# Patient Record
Sex: Female | Born: 1980 | Race: White | Hispanic: No | Marital: Married | Smoking: Former smoker
Health system: Southern US, Community
[De-identification: ages and names within clinical notes are randomized; demographics above are authoritative.]

## PROBLEM LIST (undated history)

## (undated) DIAGNOSIS — O09299 Supervision of pregnancy with other poor reproductive or obstetric history, unspecified trimester: Secondary | ICD-10-CM

## (undated) DIAGNOSIS — F32A Depression, unspecified: Secondary | ICD-10-CM

## (undated) DIAGNOSIS — K219 Gastro-esophageal reflux disease without esophagitis: Secondary | ICD-10-CM

## (undated) DIAGNOSIS — D649 Anemia, unspecified: Secondary | ICD-10-CM

## (undated) DIAGNOSIS — F419 Anxiety disorder, unspecified: Secondary | ICD-10-CM

## (undated) DIAGNOSIS — T8859XA Other complications of anesthesia, initial encounter: Secondary | ICD-10-CM

## (undated) DIAGNOSIS — Z9889 Other specified postprocedural states: Secondary | ICD-10-CM

## (undated) DIAGNOSIS — M1990 Unspecified osteoarthritis, unspecified site: Secondary | ICD-10-CM

## (undated) DIAGNOSIS — I10 Essential (primary) hypertension: Secondary | ICD-10-CM

## (undated) DIAGNOSIS — O24419 Gestational diabetes mellitus in pregnancy, unspecified control: Secondary | ICD-10-CM

## (undated) DIAGNOSIS — L509 Urticaria, unspecified: Secondary | ICD-10-CM

## (undated) DIAGNOSIS — F329 Major depressive disorder, single episode, unspecified: Secondary | ICD-10-CM

## (undated) DIAGNOSIS — R112 Nausea with vomiting, unspecified: Secondary | ICD-10-CM

## (undated) DIAGNOSIS — J189 Pneumonia, unspecified organism: Secondary | ICD-10-CM

## (undated) HISTORY — DX: Urticaria, unspecified: L50.9

---
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
## 2002-09-13 ENCOUNTER — Emergency Department (HOSPITAL_COMMUNITY): Admission: EM | Admit: 2002-09-13 | Discharge: 2002-09-13 | Payer: Self-pay | Admitting: Emergency Medicine

## 2003-09-30 ENCOUNTER — Emergency Department (HOSPITAL_COMMUNITY): Admission: EM | Admit: 2003-09-30 | Discharge: 2003-10-01 | Payer: Self-pay | Admitting: Emergency Medicine

## 2004-05-27 ENCOUNTER — Emergency Department (HOSPITAL_COMMUNITY): Admission: EM | Admit: 2004-05-27 | Discharge: 2004-05-27 | Payer: Self-pay | Admitting: Emergency Medicine

## 2006-03-29 IMAGING — CR DG ABDOMEN ACUTE W/ 1V CHEST
3 series · 3 of 3 positions shown · non-contrast
Comparison: none

CLINICAL DATA: midabdominal pressure for one week
 ACUTE ABDOMINAL SERIES (INCLUDING ONE VIEW CHEST)
 No comparison.  PA chest demonstrates normal cardiomediastinal contours and clear lungs aside from a possible granuloma in the midright lung.  There is no pleural effusion or pneumoperitoneum.  Supine and erect views of the abdomen demonstrate a normal bowel gas pattern.  There are no suspicious calcifications.  Bilateral pelvic calcifications are likely phleboliths.  The patient has undergone previous left hip pinning.
 IMPRESSION
 No active cardiopulmonary or abdominal process demonstrated.

[view not recorded (1 of 3)]
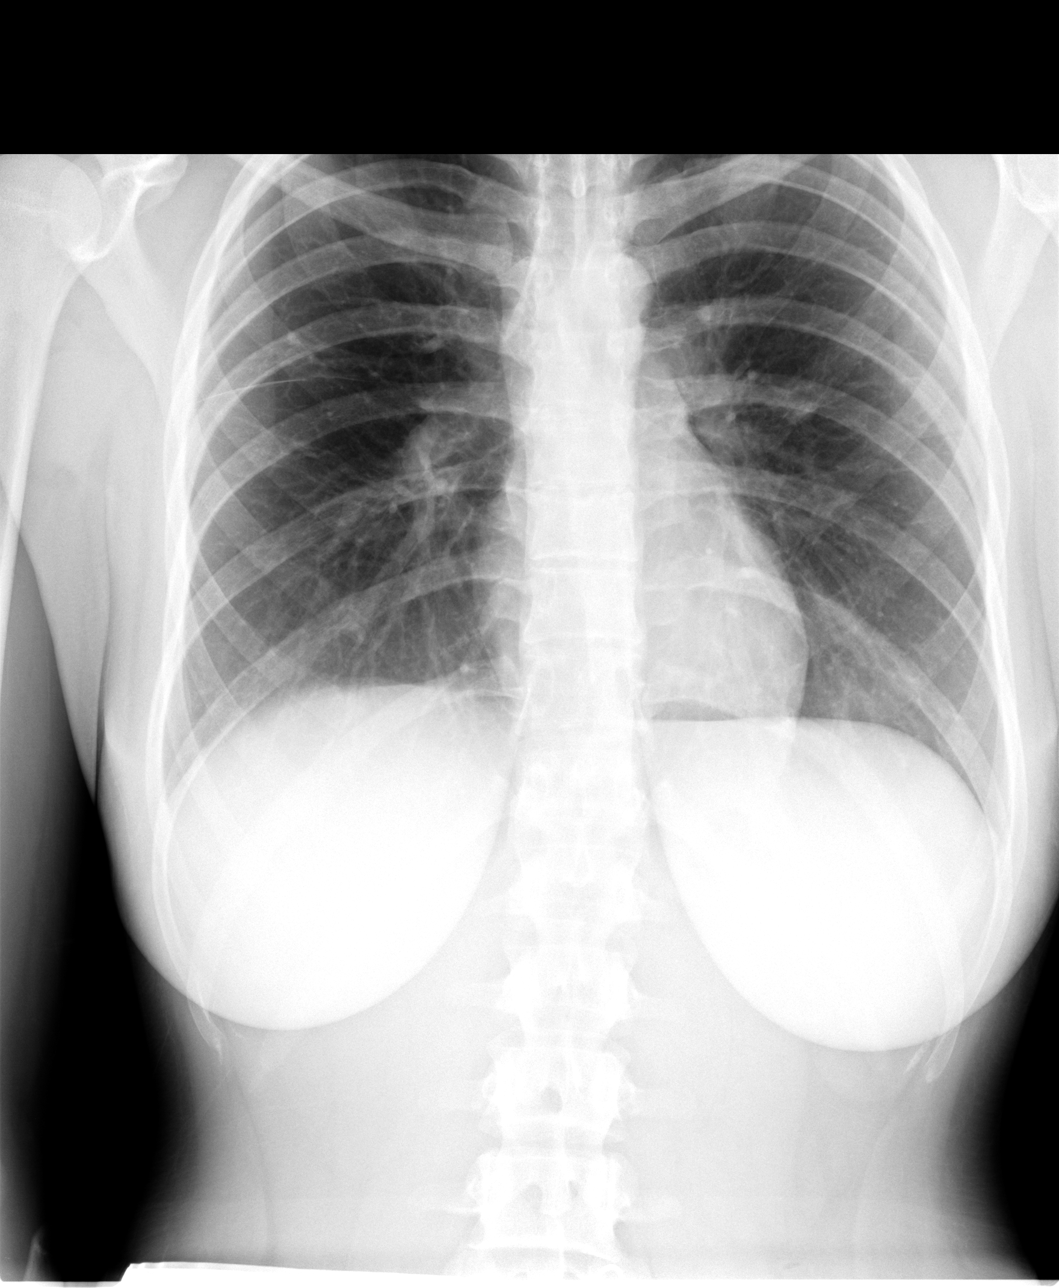

[view not recorded (2 of 3)]
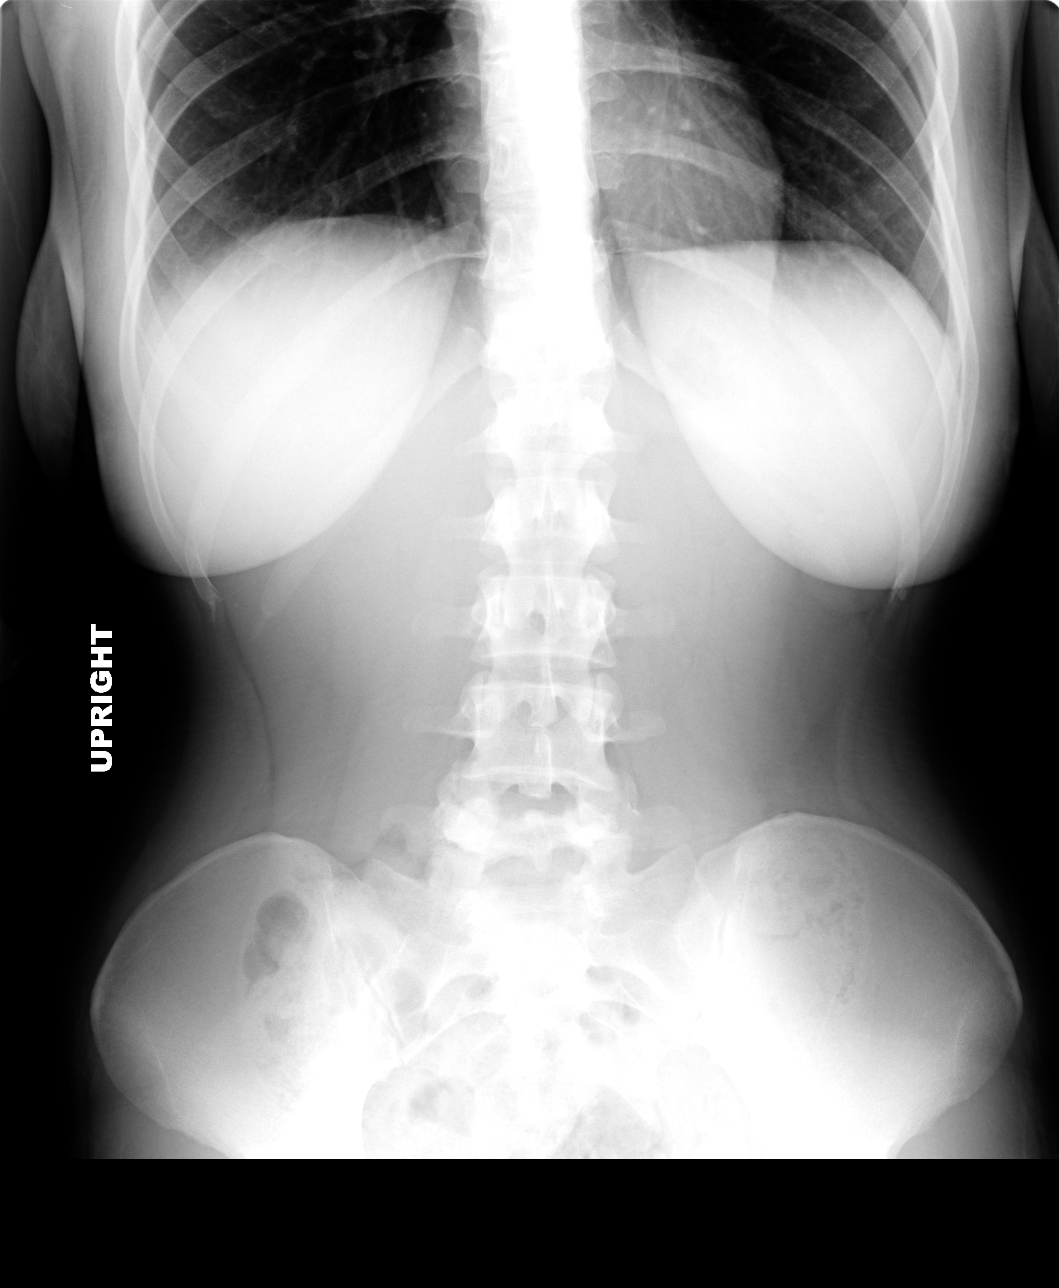

[view not recorded (3 of 3)]
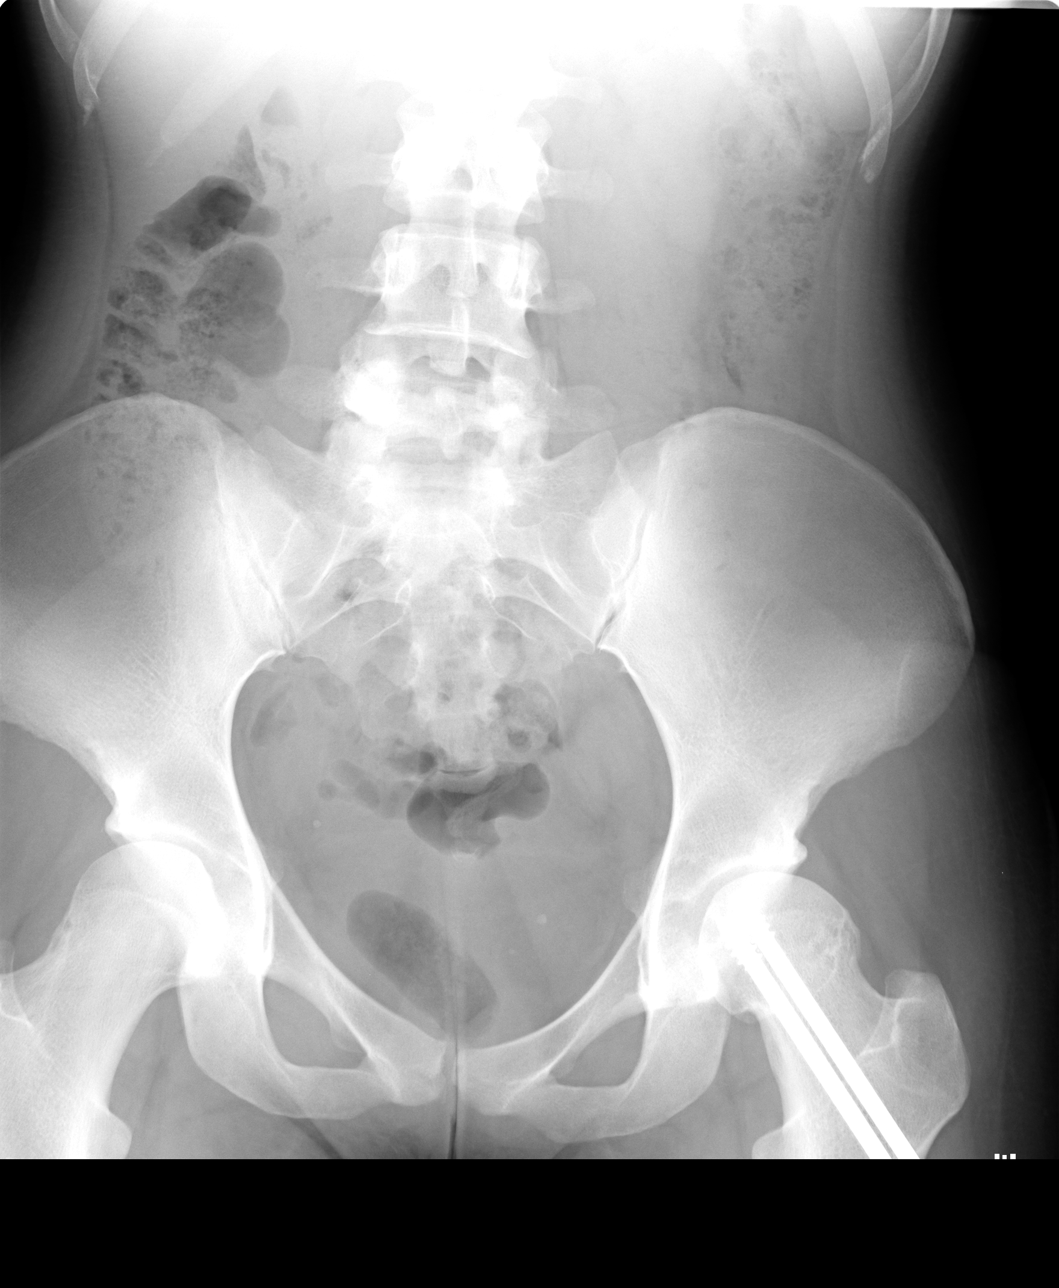

[3 of 3 positions shown; findings below may reference images not displayed]

## 2008-02-20 ENCOUNTER — Encounter (INDEPENDENT_AMBULATORY_CARE_PROVIDER_SITE_OTHER): Payer: Self-pay | Admitting: Obstetrics and Gynecology

## 2008-02-20 ENCOUNTER — Inpatient Hospital Stay (HOSPITAL_COMMUNITY): Admission: AD | Admit: 2008-02-20 | Discharge: 2008-02-23 | Payer: Self-pay | Admitting: Obstetrics and Gynecology

## 2011-02-18 NOTE — Discharge Summary (Signed)
NAME:  Debra Giles, Debra Giles               ACCOUNT NO.:  [id number]   MEDICAL RECORD NO.:  [id number]          PATIENT TYPE:  INP   LOCATION:  9148                          FACILITY:  [facility]   PHYSICIAN:  Michelle L. Grewal, M.D.DATE OF BIRTH:  Oct 09, 1980   DATE OF ADMISSION:  02/20/2008  DATE OF DISCHARGE:  02/23/2008                               DISCHARGE SUMMARY   ADMITTING DIAGNOSES:  1. Intrauterine pregnancy at 37-1/2 weeks estimated gestational age.  2. Breech presentation.  3. Severe right upper quadrant pain associated with elevated liver      function tests consistent with developing HELLP syndrome.   DISCHARGE DIAGNOSIS:  Status post low transverse cesarean section to a  viable female infant.   PROCEDURE:  Primary low transverse cesarean section.   REASON FOR ADMISSION:  Please see dictated H&P.   HOSPITAL COURSE:  The patient is a 30 year old white married female  primigravida who was admitted to [facility] at 37-1/2  weeks estimated gestational age for a cesarean delivery.  The patient  had been seen in the office where she had complained of some acute onset  of right upper quadrant pain with associated nausea and vomiting.  The  patient had been seen earlier in the week where she was noted to have  some elevation in her blood pressure, however, mild at 130 to 80s.  PIH  panel had revealed an elevation in SGPT of 44.  The uric acid was 4.6.  The patient did undergo ultrasound which revealed the baby was in a  breech presentation.  In view of this severe right upper quadrant pain,  elevated liver function tests and blood pressure, a decision was made to  proceed with a primary cesarean delivery in view of developing HELLP  syndrome.  The patient was now admitted for delivery.  The patient was  taken to the operating room where spinal anesthesia was administered  without difficulty.  A low transverse incision was made with delivery of  a viable female infant with  Agars of 8 at 1 minute and 9 at 5 minutes.  The patient tolerated the procedure well and was taken to the recovery  room in stable condition.  The patient was subsequently transferred to  the [facility] where she was given magnesium sulfate for 24 hours at closed  observation.  On postoperative day #1, the patient was without  complaint.  She denied any right upper quadrant pain.  Vital signs were  stable.  Blood pressures in the 123/60s.  SGOT was 26 and SGPT was 36.  Hemoglobin 8.6, platelet count 229,000.  Deep tendon reflex were 2+.  Abdomen soft.  Fundus firm and nontender.  Abdominal dressing noted to  be clean, dry and intact.  The patient continued on magnesium sulfate  and PIH labs were ordered for the following morning.  On postoperative  day #2, the patient was doing well.  She denied any complaints of  headache, blurred vision or right upper quadrant pain.  Blood pressure  was 120/70s.  Abdomen soft.  Abdominal dressing had been removed  revealing an incision that  was clean, dry and intact.   Laboratory findings had revealed normal lab values with some slight  decrease in platelet count  to 183,000, hemoglobin was stable and 8.4.  The patient was now transferred to the mother baby unit.  On  postoperative day #3, the patient did desire discharge.  Vital signs  were stable.  She was afebrile.  Fundus firm and nontender.  Incision  was clean, dry and intact.  Staples were removed and the patient was  later discharged home.   CONDITION ON DISCHARGE:  Stable.   DIET:  Regular as tolerated.   ACTIVITY:  No heavy lifting, no driving x2 weeks, and no vaginal entry.   FOLLOW UP:  The patient to follow up in the office in 1 week for an  incision check.  She is to call for temperature greater than 100  degrees, persistent nausea, vomiting, heavy vaginal bleeding, and/or  redness or drainage from the incisional site.  The patient was also  instructed to call for headache, blurred vision,  or right upper quadrant  pain.   DISCHARGE MEDICATIONS:  1. Tylox #30 one p.o. every 4-6 hours p.r.n.  2. Motrin 600 mg every 6 hours.  3. Prenatal vitamins one p.o. daily.  4. Colace n.p.o. daily p.r.n.      Julio Sicks, N.P.      Stann Mainland. Vincente Poli, M.D.  Electronically Signed    CC/MEDQ  D:  02/23/2008  T:  02/23/2008  Job:  [id number]

## 2011-02-18 NOTE — H&P (Signed)
NAME:  Debra Giles, Debra Giles NO.:  [id number]   MEDICAL RECORD NO.:  [id number]          PATIENT TYPE:   LOCATION:                                 FACILITY:   PHYSICIAN:  Juluis Mire, M.D.   DATE OF BIRTH:  03/02/1981   DATE OF ADMISSION:  DATE OF DISCHARGE:                              HISTORY & PHYSICAL   The patient is a 30 year old primigravida female at 37-1/2 weeks.  She  presents with the acute onset of right upper quadrant pain that is  fairly intense and severe, increasing in severity with associated nausea  and vomiting.  She had been evaluated in the office earlier in the week  with elevated blood pressure.  Blood pressures here were slightly  elevated in the 130s/80s.  We did PIH panel.  She had an elevated liver  function tests with an elevated SGPT of 44.  Remaining liver tests were  negative.  Her uric acid was 4.6.  We did an ultrasound.  The infant was  in the breech presentation.  In view of the severe right upper quadrant  pain, elevated liver function tests and blood pressure, we felt she was  developing HELLP syndrome and in view of advanced gestation, we decided  to proceed with primary cesarean section for which she is admitted.   In terms of allergies, patient has no known drug allergies.   MEDICATIONS:  Include prenatal vitamins.   For past medical history, family history and social history, please see  prenatal records.   REVIEW OF SYSTEMS:  Noncontributory.   PHYSICAL EXAMINATION:  The patient's blood pressure 130/88.  The vital  signs are stable.  HEENT:  The patient is normocephalic.  Pupils are equal, round, and  reactive to light and accommodation.  Extraocular movements were intact.  Sclerae and conjunctivae are clear.  Oropharynx clear.  NECK:  Without thyromegaly.  BREASTS:  Not examined.  LUNGS:  Clear.  CARDIOVASCULAR SYSTEM:  Regular rhythm and rate, grade 2/6 systolic  ejection murmur.  ABDOMEN:  Gravid uterus  consistent with date.  Severe right upper  quadrant tenderness to palpation.  PELVIC:  Cervix fingertip and long.  Breech presentation noted.  EXTREMITIES:  Edema 3+ .  Deep tendon reflexes 3+.  Two beats of clonus.  Subsequent ultrasound confirmed breech presentation.   Laboratories revealed a platelet count of 271.  She had negative  protein, did have an elevated SGPT of 44.  SGOT was 33.  LDH was 194,  uric acid was 4.6.   IMPRESSION:  1. Intrauterine pregnancy at 37-1/2 weeks' with breech presentation.  2. Severe right upper quadrant discomfort with associated elevated      liver function test consistent with developing HELLP syndrome.   PLAN:  The patient will undergo primary cesarean section.  The risks  were discussed including the risk of infection, the risk of hemorrhage,  could require transfusion with the risk of AIDS or hepatitis.  The risk  of injury to adjacent organs including bladder, bowel, ureters that  could require further exploratory surgery.  Risk of deep venous  thrombosis  or pulmonary embolus.  The risk of premature versus the risk  of HELLP syndrome has been discussed.  In view of the findings and her  symptomatology, I believe that the potential risk for the patient and  the baby out away the risk of premature.  The patient and her husband  agreed with this and will proceed with C-section.      Juluis Mire, M.D.  Electronically Signed     JSM/MEDQ  D:  02/20/2008  T:  02/21/2008  Job:  [id number]

## 2011-02-18 NOTE — Op Note (Signed)
NAME:  ROWENA, MOILANEN NO.:  [id number]   MEDICAL RECORD NO.:  [id number]         PATIENT TYPE:  INP   LOCATION:                                FACILITY:  [facility]   PHYSICIAN:  Juluis Mire, M.D.   DATE OF BIRTH:  [birth year]   DATE OF PROCEDURE:  02/20/2008  DATE OF DISCHARGE:                               OPERATIVE REPORT   PREOPERATIVE DIAGNOSES:  1. Intrauterine pregnancy at 37-1/2 weeks'.  2. Breech presentation.  3. Severe right upper quadrant pain with elevated liver function tests      consistent with developing toxemia.   POSTOPERATIVE DIAGNOSES:  1. Intrauterine pregnancy at 37-1/2 weeks'.  2. Breech presentation.  3. Severe right upper quadrant pain with elevated liver function tests      consistent with developing toxemia.   PROCEDURE:  Low transverse cesarean section.   SURGEON:  Juluis Mire, MD   ANESTHESIA:  Spinal.   ESTIMATED BLOOD LOSS:  500-600 mL.   PACKS AND DRAINS:  None.   INTRAOPERATIVE BLOOD PLACED:  None.   COMPLICATIONS:  None.   INDICATIONS:  Dictated in history and physical.   Procedure was as follows; the patient was taken to OR, placed in supine  position with left lateral tilt.  After satisfactory level of spinal  anesthesia was obtained, the abdomen was prepped out with Betadine and  draped in sterile field.  A low transverse skin incision was made with  knife carried through subcutaneous tissue.  The anterior rectus fascia  was entered sharply and the incision fashioned laterally.  Fascia was  taken off the muscle superiorly and inferiorly.  Rectus muscles were  separated in the midline.  Peritoneum was entered sharply.  Incision of  perineum extended both superiorly and inferiorly.  A low transverse  bladder flap was developed.  A low transverse uterine scission begun  with a knife and extended laterally using manual traction.  The infant  presented in the breech presentation was delivered.  The infant was a  viable female.  Apgars were 8/9.  Weight and pH were pending.  Placenta  was delivered manually and sent to pathology.  Uterus was exteriorized  for closure.  Uterus was closed with locking suture of 0 chromic using a  two-layer closure technique.  We had good hemostasis, clear urine  output.  Uterus was turned to the abdominal cavity.  It was of note, the  tubes and ovaries were unremarkable.  We irrigated the pelvis, had good  hemostasis, again clear urine output.  Muscles and peritoneum closed  with a running suture of  3-0 Vicryl.  Fascia closed with a running suture of 0 PDS.  Skin was  closed with staples and Steri-Strips.  Sponge, instrument, and needle  count was correct by circulating nurse x2.  Foley catheter remained  clear at time of closure.  The patient tolerated suture was turned to  recovery room in good condition.      Juluis Mire, M.D.  Electronically Signed     JSM/MEDQ  D:  02/20/2008  T:  02/21/2008  Job:  [id number] 

## 2011-07-02 LAB — COMPREHENSIVE METABOLIC PANEL
ALT: 44 — ABNORMAL HIGH
AST: 30
AST: 33
Alkaline Phosphatase: 117
Alkaline Phosphatase: 80
Alkaline Phosphatase: 88
BUN: 9
CO2: 24
CO2: 26
Calcium: 7.7 — ABNORMAL LOW
Calcium: 8.1 — ABNORMAL LOW
Chloride: 104
Chloride: 105
Chloride: 110
Creatinine, Ser: 0.73
GFR calc Af Amer: >60
GFR calc non Af Amer: >60
GFR calc non Af Amer: >60
Glucose, Bld: 136 — ABNORMAL HIGH
Sodium: 134 — ABNORMAL LOW
Sodium: 140
Total Bilirubin: 0.8
Total Bilirubin: 0.8
Total Protein: 5.9 — ABNORMAL LOW

## 2011-07-02 LAB — CBC
HCT: 24.9 — ABNORMAL LOW
HCT: 25.3 — ABNORMAL LOW
HCT: 31.2 — ABNORMAL LOW
MCHC: 33.9
MCHC: 34
MCV: 79
MCV: 79.1
MCV: 80.2
Platelets: 275
RBC: 3.2 — ABNORMAL LOW
RDW: 14.4
WBC: 12.9 — ABNORMAL HIGH
WBC: 13.3 — ABNORMAL HIGH
WBC: 9.8

## 2011-07-02 LAB — URINALYSIS, ROUTINE W REFLEX MICROSCOPIC
Ketones, ur: NEGATIVE
Protein, ur: NEGATIVE
Specific Gravity, Urine: >1.03 — ABNORMAL HIGH
Urobilinogen, UA: 0.2
pH: 6

## 2011-07-02 LAB — LACTATE DEHYDROGENASE
LDH: 190
LDH: 194
LDH: 227

## 2012-08-23 DIAGNOSIS — Z975 Presence of (intrauterine) contraceptive device: Secondary | ICD-10-CM | POA: Insufficient documentation

## 2016-06-06 DIAGNOSIS — I10 Essential (primary) hypertension: Secondary | ICD-10-CM | POA: Insufficient documentation

## 2016-06-06 DIAGNOSIS — F3342 Major depressive disorder, recurrent, in full remission: Secondary | ICD-10-CM | POA: Insufficient documentation

## 2016-06-06 DIAGNOSIS — Z8679 Personal history of other diseases of the circulatory system: Secondary | ICD-10-CM | POA: Insufficient documentation

## 2016-06-06 DIAGNOSIS — K219 Gastro-esophageal reflux disease without esophagitis: Secondary | ICD-10-CM | POA: Insufficient documentation

## 2016-06-06 DIAGNOSIS — Z6835 Body mass index (BMI) 35.0-35.9, adult: Secondary | ICD-10-CM | POA: Insufficient documentation

## 2017-03-24 ENCOUNTER — Emergency Department (HOSPITAL_COMMUNITY)
Admission: EM | Admit: 2017-03-24 | Discharge: 2017-03-24 | Disposition: A | Discharge status: Home / Self Care | Attending: Emergency Medicine | Admitting: Emergency Medicine

## 2017-03-24 ENCOUNTER — Encounter (HOSPITAL_COMMUNITY): Payer: Self-pay | Admitting: Obstetrics and Gynecology

## 2017-03-24 DIAGNOSIS — Y939 Activity, unspecified: Secondary | ICD-10-CM | POA: Diagnosis not present

## 2017-03-24 DIAGNOSIS — Y999 Unspecified external cause status: Secondary | ICD-10-CM | POA: Insufficient documentation

## 2017-03-24 DIAGNOSIS — S81851A Open bite, right lower leg, initial encounter: Secondary | ICD-10-CM | POA: Diagnosis not present

## 2017-03-24 DIAGNOSIS — Z23 Encounter for immunization: Secondary | ICD-10-CM | POA: Insufficient documentation

## 2017-03-24 DIAGNOSIS — I10 Essential (primary) hypertension: Secondary | ICD-10-CM | POA: Insufficient documentation

## 2017-03-24 DIAGNOSIS — W540XXA Bitten by dog, initial encounter: Secondary | ICD-10-CM | POA: Insufficient documentation

## 2017-03-24 DIAGNOSIS — Y929 Unspecified place or not applicable: Secondary | ICD-10-CM | POA: Diagnosis not present

## 2017-03-24 HISTORY — DX: Essential (primary) hypertension: I10

## 2017-03-24 HISTORY — DX: Unspecified osteoarthritis, unspecified site: M19.90

## 2017-03-24 LAB — I-STAT BETA HCG BLOOD, ED (MC, WL, AP ONLY)

## 2017-03-24 MED ORDER — OXYCODONE-ACETAMINOPHEN 5-325 MG PO TABS: 1.0000 | ORAL_TABLET | Freq: Four times a day (QID) | ORAL | 0 refills | Status: DC | PRN

## 2017-03-24 MED ORDER — TETANUS-DIPHTH-ACELL PERTUSSIS 5-2.5-18.5 LF-MCG/0.5 IM SUSP
0.5000 mL | Freq: Once | INTRAMUSCULAR | Status: AC
  Administered 2017-03-24: 0.5 mL via INTRAMUSCULAR
  Filled 2017-03-24: qty 0.5

## 2017-03-24 MED ORDER — ONDANSETRON 4 MG PO TBDP
4.0000 mg | ORAL_TABLET | Freq: Once | ORAL | Status: AC
  Administered 2017-03-24: 4 mg via ORAL
  Filled 2017-03-24: qty 1

## 2017-03-24 MED ORDER — AMOXICILLIN-POT CLAVULANATE 875-125 MG PO TABS
1.0000 | ORAL_TABLET | Freq: Once | ORAL | Status: AC
  Administered 2017-03-24: 1 via ORAL
  Filled 2017-03-24: qty 1

## 2017-03-24 MED ORDER — AMOXICILLIN-POT CLAVULANATE 875-125 MG PO TABS: 1.0000 | ORAL_TABLET | Freq: Two times a day (BID) | ORAL | 0 refills | Status: DC

## 2017-03-24 MED ORDER — HYDROMORPHONE HCL 1 MG/ML IJ SOLN
1.0000 mg | Freq: Once | INTRAMUSCULAR | Status: AC
  Administered 2017-03-24: 1 mg via INTRAVENOUS
  Filled 2017-03-24: qty 1

## 2017-03-24 NOTE — ED Provider Notes (Signed)
[facility]-EMERGENCY DEPT Provider Note   CSN: [id number] Arrival date & time: 03/24/17  1647     History   Chief Complaint Chief Complaint  Patient presents with  . Animal Bite    HPI Debra Giles is a 36 y.o. female who presents to the Emergency Department with multiple puncture wounds 2/2 to a dog bite this afternoon. She reports that her pit bull mix was fighting with a labrador from the neighborhood when one of the dogs got stuck on the other's collar and bit her when she tried to separate the dogs. She reports she called the Vet in route to the ED to verify both dogs were UTD on all vaccinations. She is unsure of her last tetanus. No treatment tried PTA aside from applying a bandage to the wound. She reports the pain is constant and 10/10  No pertinent PMH.   The history is provided by the patient and the spouse. No language interpreter was used.    Past Medical History:  Diagnosis Date  . Arthritis   . Hypertension     There are no active problems to display for this patient.   History reviewed. No pertinent surgical history.  OB History    Gravida Para Term Preterm AB Living             1   SAB TAB Ectopic Multiple Live Births                   Home Medications    Prior to Admission medications   Medication Sig Start Date End Date Taking? Authorizing Provider  amoxicillin-clavulanate (AUGMENTIN) 875-125 MG tablet Take 1 tablet by mouth 2 (two) times daily. 03/24/17   Ranessa Kosta A, PA-C  oxyCODONE-acetaminophen (PERCOCET/ROXICET) 5-325 MG tablet Take 1-2 tablets by mouth every 6 (six) hours as needed for severe pain. 03/24/17   Henri Guedes, Coral Else, PA-C    Family History History reviewed. No pertinent family history.  Social History Social History  Substance Use Topics  . Smoking status: Never Smoker  . Smokeless tobacco: Never Used  . Alcohol use No     Allergies   Patient has no allergy information on record.   Review of Systems Review of  Systems  Constitutional: Negative for chills and fever.  Musculoskeletal: Positive for myalgias. Negative for arthralgias.  Skin: Positive for wound.   Physical Exam Updated Vital Signs BP 117/72 (BP Location: Right Arm)   Pulse 62   Resp 16   Ht 5\' 5"  (1.651 m)   Wt 108.9 kg (240 lb)   LMP 02/24/2017 (Exact Date)   SpO2 95%   BMI 39.94 kg/m   Physical Exam  Constitutional: She appears well-developed and well-nourished. No distress.  Cardiovascular: Normal rate, regular rhythm, normal heart sounds and intact distal pulses.  Exam reveals no gallop and no friction rub.   No murmur heard. Pulmonary/Chest: Effort normal. No respiratory distress. She has no wheezes. She has no rales.  Musculoskeletal: Normal range of motion. She exhibits tenderness.  Patient with jagged 3 puncture wounds to the anterior aspect of the right lower leg. The largest wound is 1.5 cm middle wound. There is a 0.5 mm wound to the left and a 0.75 mm wound to the right. Hemostatic. No evidence of gangrene. Sensation intact. 5/5 motor strength of the bilateral lower extremities. Able to move all digits. No evidence of FB.   Skin: She is not diaphoretic.     ED Treatments / Results  Labs (  all labs ordered are listed, but only abnormal results are displayed) Labs Reviewed  I-STAT BETA HCG BLOOD, ED ([facility], [facility], [facility] ONLY)    EKG  EKG Interpretation None       Radiology No results found.  Procedures Irrigation Date/Time: 03/26/2017 12:35 AM Performed by: Lilian KapurMCDONALD, Patrycja Mumpower A Authorized by: Frederik PearMCDONALD, Laurisa Sahakian A  Consent: Verbal consent obtained. Consent given by: patient Local anesthesia used: no  Anesthesia: Local anesthesia used: no  Sedation: Patient sedated: no Patient tolerance: Patient tolerated the procedure well with no immediate complications    (including critical care time)  Medications Ordered in ED Medications  HYDROmorphone (DILAUDID) injection 1 mg (1 mg Intravenous Given 03/24/17 1929)    Tdap (BOOSTRIX) injection 0.5 mL (0.5 mLs Intramuscular Given 03/24/17 2019)  amoxicillin-clavulanate (AUGMENTIN) 875-125 MG per tablet 1 tablet (1 tablet Oral Given 03/24/17 2022)  ondansetron (ZOFRAN-ODT) disintegrating tablet 4 mg (4 mg Oral Given 03/24/17 2018)     Initial Impression / Assessment and Plan / ED Course  I have reviewed the triage vital signs and the nursing notes.  Pertinent labs & imaging results that were available during my care of the patient were reviewed by me and considered in my medical decision making (see chart for details).     Patient with 3 wounds to the right lower leg secondary to a dig bite this afternoon. Discussed the patient with Dr. Karma GanjaLinker, attending physician. Vaccinations UTP on the animal in addition to the other dog in the incident. The wound was copiously irrigated and explored through a full range of motion. No FBs noted. At this time, the full depth of the wound can be assess, and I do not feel I need an X-ray given the PE and history. After extensive cleaning, the wound was covered with Xeroform gauze and a gauze dressing was applied. TDap updated in the ED. First dose of Augmentin give in the ED. Pain controlled. Strict return precautions given. VSS NAD. Will recommend follow up for wound re-check in 3-5 days. NAD. The patient is stable for d/c at this time.   Final Clinical Impressions(s) / ED Diagnoses   Final diagnoses:  Dog bite, initial encounter    New Prescriptions Discharge Medication List as of 03/24/2017  8:13 PM    START taking these medications   Details  amoxicillin-clavulanate (AUGMENTIN) 875-125 MG tablet Take 1 tablet by mouth 2 (two) times daily., Starting Tue 03/24/2017, Print    oxyCODONE-acetaminophen (PERCOCET/ROXICET) 5-325 MG tablet Take 1-2 tablets by mouth every 6 (six) hours as needed for severe pain., Starting Tue 03/24/2017, Print         Karen Kinnard A, PA-C 03/26/17 [id number]    Jerelyn ScottLinker, Martha, MD 03/26/17  1750

## 2017-03-24 NOTE — Discharge Instructions (Signed)
Please change the bandage on the wound daily. You can keep the area clean with warm soap and water. Please start taking Augmentin tomorrow. He will take this medication twice daily for the next 10 days. Your first dose for tonight has been given here. You've also been given a short prescription of Percocet for pain control. This medication can be addicting and should not be used before driving or working. If you develop fever, chills, or if the wound becomes red, hot, or swollen, please return to the emergency department for reevaluation.

## 2017-03-24 NOTE — ED Triage Notes (Signed)
Pt states she was at home and her dogs were fighting when "one of the dogs got stuck on the other's collar and freaked out" and she tried to remove the collar and and one of the dogs bit her on the right leg. Pt called the Vet on the way to the ER to verify both dogs were caught up on all their shots.

## 2017-09-25 DIAGNOSIS — D500 Iron deficiency anemia secondary to blood loss (chronic): Secondary | ICD-10-CM

## 2017-09-25 HISTORY — DX: Iron deficiency anemia secondary to blood loss (chronic): D50.0

## 2018-05-06 ENCOUNTER — Encounter: Payer: Self-pay | Admitting: Allergy & Immunology

## 2018-05-06 ENCOUNTER — Ambulatory Visit (INDEPENDENT_AMBULATORY_CARE_PROVIDER_SITE_OTHER): Admitting: Allergy & Immunology

## 2018-05-06 VITALS — BP 110/80 | HR 103 | Resp 16 | Ht 65.0 in | Wt 234.0 lb

## 2018-05-06 DIAGNOSIS — L508 Other urticaria: Secondary | ICD-10-CM

## 2018-05-06 NOTE — Patient Instructions (Addendum)
1. Chronic urticaria - Your history does not have any "red flags" such as fevers, joint pains, or permanent skin changes that would be concerning for a more serious cause of hives.  - It is rather odd that the rash is isolated to the arms only and it is odd that you are so young to require a hip replacement.  - Please send me pictures of the rash so that I can put it in your chart: [email or web address] - We will get some labs to rule out serious causes of hives: complete blood count, tryptase level, chronic urticaria panel, ANA, CMP, ESR, and CRP. - We will call you in 1-2 weeks with the results of the testing.  - Continue to note any triggers of the rash.  - In the meantime, start suppressive dosing of antihistamines:   - Morning: Zyrtec (cetirizine) 10-79m (two tablets)  - Evening: Zyrtec (cetirizine) 10-284m(two tablets) + Singulair (montelukast) 1022m- If the above is not working, try adding: Zantac (ranitidine) 300m26mwo tablets) - You can change this dosing at home, decreasing the dose as needed or increasing the dosing as needed.   2. Return in about 3 months (around 08/06/2018).   Please inform us o[place]any Emergency Department visits, hospitalizations, or changes in symptoms. Call us b[place]oreaore going to the ED for breathing or allergy symptoms since we might be able to fit you in for a sick visit. Feel free to contact us a[place]atime with any questions, problems, or concerns.  It was a pleasure to meet you today!  Websites that have reliable patient information: 1. American Academy of Asthma, Allergy, and Immunology: www.aaaai.org 2. Food Allergy Research and Education (FARE): foodallergy.org 3. Mothers of Asthmatics: http://www.asthmacommunitynetwork.org 4. American College of Allergy, Asthma, and Immunology: www.MonthlyElectricBill.co.ukake sure you are registered to vote! If you have moved or changed any of your contact information, you will need to get this updated before  voting!

## 2018-05-06 NOTE — Progress Notes (Signed)
NEW PATIENT  Date of Service/Encounter:  08/0/19  Referring provider: Junie Spencer, PA   Assessment:   Chronic urticaria  Early onset arthritis of the hip   Debra Giles is a delightful 37 year old female presenting with emergence of a rash.  She has pictures of this, and they certainly look like bites.  However, they have come and gone since February 2019.  This is despite no change in her activities outdoors.  She is typically an indoor person, but they started when she was doing some heavy lifting outdoors.  These areas have never been draining.  She denies any trauma to these areas.  They do clear up without the use of antibiotics.  They are extremely pruritic, which certainly points towards a histamine mediated process.  There is certainly no allergen trigger to these, including foods.  No one else in the family has these, making an indoor insects unlikely.  She does have a history of early onset arthritis of her hip, which is rather odd for 37 year old.  She has never been seen by rheumatologist, but lesions such as these can be seen sometimes in rheumatoid arthritis.  Typically they are more nodular and less erythematous, but we will rule this out with some blood work.  We deferred skin testing since there was no history of allergic rhinitis symptoms whatsoever.  Plan/Recommendations:   1. Chronic urticaria - Your history does not have any "red flags" such as fevers, joint pains, or permanent skin changes that would be concerning for a more serious cause of hives.  - It is rather odd that the rash is isolated to the arms only and it is odd that you are so young to require a hip replacement.  - We will get some labs to rule out serious causes of hives: complete blood count, tryptase level, chronic urticaria panel, ANA, CMP, ESR, and CRP. - We will call you in 1-2 weeks with the results of the testing.  - Continue to note any triggers of the rash.  - In the meantime, start  suppressive dosing of antihistamines:   - Morning: Zyrtec (cetirizine) 10-26m (two tablets)  - Evening: Zyrtec (cetirizine) 10-235m(two tablets) + Singulair (montelukast) 1025m- If the above is not working, try adding: Zantac (ranitidine) 300m23mwo tablets) - You can change this dosing at home, decreasing the dose as needed or increasing the dosing as needed.   2. Return in about 3 months (around 08/06/2018).   Subjective:   CaseEMMALEE SOLIVANa 36 y47. female presenting today for evaluation of  Chief Complaint  Patient presents with  . Urticaria  . Rash    CaseKAMAIYA Giles a history of the following: There are no active problems to display for this patient.   History obtained from: chart review and patient.  CaseBrynda Giles referred by GordMindi Curling-C.     CaseTericaa 36 y33. female presenting for an evaluation of hives. She reports that on Febr02/29/2024r dog died. She had to drag his body out of the woods. She thought that she was bit by a bug. It will start as a mosquito bite and then become large and inflamed and pruritis. It is only on her arms and on the elbow up. These lesions become hard and very itchy. They last in total around one week or maybe two weeks and then they are gone. There is some permanent changes with some scarring. Cortisone cream does help  with the symptoms. She has tried another steroid cream but the hydrocortisone cream seems to help more. She had no GI symptoms at all and no fevers at all. Aside from the warmth at the spot itself, there was not redness. She has not tried taking regular antihistamines, but she does take them as needed without an improvement.  She did have an appointment with a dermatologist, but when her rash cleared up right before the appointment, she canceled it.  She has not rescheduled.  No one else is getting these in her home. She lives with her husband and two children. She does not works outside of the  home.  She is not prone to allergies and has not ARC symptoms or asthma symptoms. She denies food allergies. There is a strong family history of arthritis. She does have a history of left hip pain and actually has pins within it. She does not see a Rheumatologist but she does have an orthopedic doctor (Dr. Lorenz Coaster).    Otherwise, there is no history of other atopic diseases, including asthma, drug allergies, food allergies, environmental allergies, or stinging insect allergies There is no significant infectious history. Vaccinations are up to date.      Past Medical History: There are no active problems to display for this patient.   Medication List:  Allergies as of 05/06/2018      Reactions   Percocet [oxycodone-acetaminophen] Itching      Medication List        Accurate as of 05/06/18 11:59 PM. Always use your most recent med list.          cetirizine 10 MG tablet Commonly known as:  ZYRTEC AM: 10-20 mg( 2 tabs), and PM 10-20 mg ( 2 tabs)   citalopram 20 MG tablet Commonly known as:  CELEXA TAKE 1 TABLET(20 MG) BY MOUTH DAILY   ferrous sulfate 325 (65 FE) MG tablet Take by mouth.   lisinopril 20 MG tablet Commonly known as:  PRINIVIL,ZESTRIL Take by mouth.   montelukast 10 MG tablet Commonly known as:  SINGULAIR Take 1 tablet (10 mg total) by mouth at bedtime.   omeprazole 20 MG capsule Commonly known as:  PRILOSEC Take by mouth.   phentermine 37.5 MG capsule   ranitidine 300 MG tablet Commonly known as:  ZANTAC Take 1 tablet (300 mg total) by mouth 2 (two) times daily.   triamcinolone cream 0.1 % Commonly known as:  KENALOG Apply topically.       Birth History: non-contributory.   Developmental History: non-contributory.   Past Surgical History: Past Surgical History:  Procedure Laterality Date  . CESAREAN SECTION  02/20/2008  . HIP SURGERY Left 1997     Family History: Family History  Problem Relation Age of Onset  . Frontotemporal  dementia Mother        Primary Progressive Aphasia   . ADD / ADHD Brother      Social History: Debra Giles lives at home with her husband and 10 year old son. There is a 47yo step daughter who lives in   She was in a house that is 35 years old.  There is hardwood throughout the home, aside from the bedrooms which have carpet.  There is electric heating and central cooling.  There are 2 dogs inside of the home.  There are no dust mite covers on the bedding.  There is no tobacco exposure.  She has never been a smoker.    Review of Systems: a 14-point review of systems  is pertinent for what is mentioned in HPI.  Otherwise, all other systems were negative. Constitutional: negative other than that listed in the HPI Eyes: negative other than that listed in the HPI Ears, nose, mouth, throat, and face: negative other than that listed in the HPI Respiratory: negative other than that listed in the HPI Cardiovascular: negative other than that listed in the HPI Gastrointestinal: negative other than that listed in the HPI Genitourinary: negative other than that listed in the HPI Integument: negative other than that listed in the HPI Hematologic: negative other than that listed in the HPI Musculoskeletal: negative other than that listed in the HPI Neurological: negative other than that listed in the HPI Allergy/Immunologic: negative other than that listed in the HPI    Objective:   Blood pressure 110/80, pulse (!) 103, resp. rate 16, height _0  (1.651 m), weight 234 lb (106.1 kg), last menstrual period 04/27/2018, SpO2 97 %. Body mass index is 38.94 kg/m.   Physical Exam:  General: Alert, interactive, in no acute distress. Pleasant female. Absolutely hilarious Eyes: No conjunctival injection bilaterally, no discharge on the right, no discharge on the left and no Horner-Trantas dots present. PERRL bilaterally. EOMI without pain. No photophobia.  Ears: Right TM pearly gray with normal light reflex,  Left TM pearly gray with normal light reflex, Right TM intact without perforation and Left TM intact without perforation.  Nose/Throat: External nose within normal limits and septum midline. Turbinates non-edematous without discharge. Posterior oropharynx erythematous without cobblestoning in the posterior oropharynx. Tonsils unremarklable without exudates.  Tongue without thrush. Neck: Supple without thyromegaly. Trachea midline. Adenopathy: no enlarged lymph nodes appreciated in the anterior cervical, occipital, axillary, epitrochlear, inguinal, or popliteal regions. Lungs: Clear to auscultation without wheezing, rhonchi or rales. No increased work of breathing. CV: Normal S1/S2. No murmurs. Capillary refill <2 seconds.  Abdomen: Nondistended, nontender. No guarding or rebound tenderness. Bowel sounds present in all fields and hypoactive  Skin: Warm and dry, without lesions or rashes. There are 2-3 pinpoint eschars that are 31m in diameter on the left arm, otherwise the exam is unremarkable. There is no dermatographism.  Extremities:  No clubbing, cyanosis or edema. Neuro:   Grossly intact. No focal deficits appreciated. Responsive to questions.  Diagnostic studies: none      JSalvatore Marvel MD [facility]

## 2018-05-07 ENCOUNTER — Encounter: Payer: Self-pay | Admitting: Allergy & Immunology

## 2018-05-07 MED ORDER — CETIRIZINE HCL 10 MG PO TABS: ORAL_TABLET | ORAL | 5 refills | Status: DC

## 2018-05-07 MED ORDER — RANITIDINE HCL 300 MG PO TABS: 300.0000 mg | ORAL_TABLET | Freq: Two times a day (BID) | ORAL | 5 refills | Status: DC

## 2018-05-07 MED ORDER — MONTELUKAST SODIUM 10 MG PO TABS: 10.0000 mg | ORAL_TABLET | Freq: Every day | ORAL | 5 refills | Status: DC

## 2018-05-12 ENCOUNTER — Encounter: Payer: Self-pay | Admitting: Allergy & Immunology

## 2018-05-14 LAB — COMPREHENSIVE METABOLIC PANEL
ALK PHOS: 58 IU/L (ref 39–117)
ALT: 21 IU/L (ref 0–32)
AST: 15 IU/L (ref 0–40)
Albumin/Globulin Ratio: 1.9 (ref 1.2–2.2)
Albumin: 4.5 g/dL (ref 3.5–5.5)
BILIRUBIN TOTAL: 0.4 mg/dL (ref 0.0–1.2)
BUN/Creatinine Ratio: 16 (ref 9–23)
BUN: 12 mg/dL (ref 6–20)
CHLORIDE: 100 mmol/L (ref 96–106)
CO2: 26 mmol/L (ref 20–29)
Calcium: 9.3 mg/dL (ref 8.7–10.2)
Creatinine, Ser: 0.74 mg/dL (ref 0.57–1.00)
GFR calc Af Amer: 122 mL/min/{1.73_m2} (ref >59)
GFR calc non Af Amer: 106 mL/min/{1.73_m2} (ref >59)
GLOBULIN, TOTAL: 2.4 g/dL (ref 1.5–4.5)
GLUCOSE: 102 mg/dL — AB (ref 65–99)
Potassium: 4.1 mmol/L (ref 3.5–5.2)
SODIUM: 141 mmol/L (ref 134–144)
Total Protein: 6.9 g/dL (ref 6.0–8.5)

## 2018-05-14 LAB — C-REACTIVE PROTEIN: CRP: 6 mg/L (ref 0–10)

## 2018-05-14 LAB — IGE+ALLERGENS ZONE 2(30)
Alternaria Alternata IgE: <0.1 kU/L
Amer Sycamore IgE Qn: <0.1 kU/L
Bermuda Grass IgE: <0.1 kU/L
Cat Dander IgE: <0.1 kU/L
Cedar, Mountain IgE: <0.1 kU/L
D Farinae IgE: <0.1 kU/L
D Pteronyssinus IgE: <0.1 kU/L
Dog Dander IgE: <0.1 kU/L
Elm, American IgE: <0.1 kU/L
Hickory, White IgE: <0.1 kU/L
IgE (Immunoglobulin E), Serum: 25 IU/mL (ref 6–495)
Maple/Box Elder IgE: <0.1 kU/L
Nettle IgE: <0.1 kU/L
Oak, White IgE: <0.1 kU/L
Penicillium Chrysogen IgE: <0.1 kU/L
Pigweed, Rough IgE: <0.1 kU/L
Plantain, English IgE: <0.1 kU/L
Ragweed, Short IgE: <0.1 kU/L
Sheep Sorrel IgE Qn: <0.1 kU/L
Stemphylium Herbarum IgE: <0.1 kU/L
Timothy Grass IgE: <0.1 kU/L
White Mulberry IgE: <0.1 kU/L

## 2018-05-14 LAB — CBC WITH DIFFERENTIAL/PLATELET
Basophils Absolute: 0 10*3/uL (ref 0.0–0.2)
Basos: 0 %
EOS (ABSOLUTE): 0.4 10*3/uL (ref 0.0–0.4)
Eos: 5 %
HEMOGLOBIN: 12.4 g/dL (ref 11.1–15.9)
Hematocrit: 39.8 % (ref 34.0–46.6)
IMMATURE GRANS (ABS): 0 10*3/uL (ref 0.0–0.1)
Immature Granulocytes: 0 %
LYMPHS: 29 %
Lymphocytes Absolute: 2.5 10*3/uL (ref 0.7–3.1)
MCH: 24 pg — ABNORMAL LOW (ref 26.6–33.0)
MCHC: 31.2 g/dL — AB (ref 31.5–35.7)
MCV: 77 fL — ABNORMAL LOW (ref 79–97)
MONOCYTES: 4 %
Monocytes Absolute: 0.3 10*3/uL (ref 0.1–0.9)
Neutrophils Absolute: 5.2 10*3/uL (ref 1.4–7.0)
Neutrophils: 62 %
PLATELETS: 342 10*3/uL (ref 150–450)
RBC: 5.16 x10E6/uL (ref 3.77–5.28)
RDW: 15.5 % — ABNORMAL HIGH (ref 12.3–15.4)
WBC: 8.4 10*3/uL (ref 3.4–10.8)

## 2018-05-14 LAB — CHRONIC URTICARIA

## 2018-05-14 LAB — SEDIMENTATION RATE: SED RATE: 26 mm/h (ref 0–32)

## 2018-05-14 LAB — ANA: ANA: NEGATIVE

## 2018-05-14 LAB — TRYPTASE: TRYPTASE: 4.8 ug/L (ref 2.2–13.2)

## 2018-05-31 ENCOUNTER — Encounter: Payer: Self-pay | Admitting: Allergy & Immunology

## 2018-05-31 ENCOUNTER — Ambulatory Visit (INDEPENDENT_AMBULATORY_CARE_PROVIDER_SITE_OTHER): Admitting: Allergy & Immunology

## 2018-05-31 VITALS — BP 112/80 | HR 72 | Resp 16

## 2018-05-31 DIAGNOSIS — L253 Unspecified contact dermatitis due to other chemical products: Secondary | ICD-10-CM

## 2018-05-31 DIAGNOSIS — J310 Chronic rhinitis: Secondary | ICD-10-CM

## 2018-05-31 MED ORDER — MONTELUKAST SODIUM 10 MG PO TABS: 10.0000 mg | ORAL_TABLET | Freq: Every day | ORAL | 5 refills | Status: DC

## 2018-05-31 NOTE — Progress Notes (Signed)
    Follow-up Note  RE: Debra MangoCasey M Renzulli MRN: [id number] DOB: 10/23/1980 Date of Office Visit: 05/31/2018  Primary care provider: Barbarann EhlersGordon, Sarah B, PA-C Referring provider: Rhae LernerGordon, Sarah B, PA-C   Shoshana returns to the office today for the patch test placement, given suspected history of contact dermatitis. 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    Diagnostics: True Test patches placed.    Plan:   Allergic contact dermatitis - Instructions provided on care of the patches for the next 48 hours. Baird Lyons- Umaima was instructed to avoid showering for the next 48 hours. Baird Lyons- Karleigh will follow up in 48 hours and 96 hours for patch readings.    Malachi BondsJoel Emerson Schreifels, MD  [facility] [place] [place]

## 2018-05-31 NOTE — Patient Instructions (Signed)
1.  Be sure to start the Singulair.  We will send this back in. 2.  Patches placed today.  No showering until Wednesday. 3.  You can take antihistamines, but no prednisone. 4.  Come back on Wednesday and Friday for patch readings.  It was so good to see you again today!

## 2018-06-02 ENCOUNTER — Ambulatory Visit: Admitting: Allergy

## 2018-06-02 DIAGNOSIS — L253 Unspecified contact dermatitis due to other chemical products: Secondary | ICD-10-CM

## 2018-06-02 NOTE — Progress Notes (Signed)
    Follow-up Note  RE: Debra MangoCasey M Pettinger MRN: [id number] DOB: 07-Mar-1981 Date of Office Visit: 06/02/2018  Primary care provider: Barbarann EhlersGordon, Sarah B, PA-C Referring provider: Rhae LernerGordon, Sarah B, PA-C   Ieasha returns to the office today for the initial patch test interpretation, given suspected history of contact dermatitis.    Diagnostics:  TRUE TEST 48 hour reading: negative  Plan:  Presumed contact dermatitis secondary to epoxy use True test is negative today including to epoxy resin at this time.  She will return in 2 days, Friday for final reading.  Margo AyeShaylar Apollos Tenbrink, MD [facility]

## 2018-06-04 ENCOUNTER — Ambulatory Visit (INDEPENDENT_AMBULATORY_CARE_PROVIDER_SITE_OTHER): Admitting: Allergy

## 2018-06-04 DIAGNOSIS — L253 Unspecified contact dermatitis due to other chemical products: Secondary | ICD-10-CM

## 2018-06-04 NOTE — Progress Notes (Signed)
    Follow-up Note  RE: Debra Giles MRN: [id number] DOB: [birth year] Date of Office Visit: 06/04/2018  Primary care provider: Barbarann EhlersGordon, Sarah B, PA-C Referring provider: Rhae LernerGordon, Sarah B, PA-C   [name] returns to the office today for the final patch test interpretation, given suspected history of contact dermatitis.    Diagnostics:  TRUE TEST 96 hour reading: #14 epoxy resin 2+ reaction with erythematous papule; #27 tidxocortol-21-pivalate 2+ reaction with erythematous papule; #31 hydrocortisone-17-butyrate 3+ reaction with erythematous papules with scaling  Plan:  Allergic contact dermatitis  The patient has been provided detailed information regarding the substances she is sensitive to, as well as products containing the substances.  Meticulous avoidance of these substances is recommended. If avoidance is not possible, the use of barrier creams or lotions is recommended.  [name], MD [facility]

## 2019-01-07 ENCOUNTER — Emergency Department (HOSPITAL_COMMUNITY)
Admission: EM | Admit: 2019-01-07 | Discharge: 2019-01-07 | Disposition: A | Discharge status: Home / Self Care | Attending: Emergency Medicine | Admitting: Emergency Medicine

## 2019-01-07 ENCOUNTER — Encounter (HOSPITAL_COMMUNITY): Payer: Self-pay | Admitting: Emergency Medicine

## 2019-01-07 ENCOUNTER — Other Ambulatory Visit: Payer: Self-pay

## 2019-01-07 DIAGNOSIS — K219 Gastro-esophageal reflux disease without esophagitis: Secondary | ICD-10-CM | POA: Diagnosis not present

## 2019-01-07 DIAGNOSIS — Z79899 Other long term (current) drug therapy: Secondary | ICD-10-CM | POA: Insufficient documentation

## 2019-01-07 DIAGNOSIS — R109 Unspecified abdominal pain: Secondary | ICD-10-CM | POA: Diagnosis present

## 2019-01-07 DIAGNOSIS — I10 Essential (primary) hypertension: Secondary | ICD-10-CM | POA: Diagnosis not present

## 2019-01-07 LAB — COMPREHENSIVE METABOLIC PANEL
ALT: 21 U/L (ref 0–44)
AST: 21 U/L (ref 15–41)
Albumin: 3.8 g/dL (ref 3.5–5.0)
Alkaline Phosphatase: 44 U/L (ref 38–126)
Anion gap: 12 (ref 5–15)
BUN: 16 mg/dL (ref 6–20)
CO2: 24 mmol/L (ref 22–32)
Calcium: 9.6 mg/dL (ref 8.9–10.3)
Chloride: 100 mmol/L (ref 98–111)
Creatinine, Ser: 0.86 mg/dL (ref 0.44–1.00)
GFR calc Af Amer: >60 mL/min (ref >60)
GFR calc non Af Amer: >60 mL/min (ref >60)
Glucose, Bld: 111 mg/dL — ABNORMAL HIGH (ref 70–99)
Potassium: 3.6 mmol/L (ref 3.5–5.1)
Sodium: 136 mmol/L (ref 135–145)
Total Bilirubin: 0.7 mg/dL (ref 0.3–1.2)
Total Protein: 6.8 g/dL (ref 6.5–8.1)

## 2019-01-07 LAB — CBC
HCT: 37.4 % (ref 36.0–46.0)
Hemoglobin: 11.5 g/dL — ABNORMAL LOW (ref 12.0–15.0)
MCH: 23.9 pg — ABNORMAL LOW (ref 26.0–34.0)
MCHC: 30.7 g/dL (ref 30.0–36.0)
MCV: 77.8 fL — ABNORMAL LOW (ref 80.0–100.0)
Platelets: 339 10*3/uL (ref 150–400)
RBC: 4.81 MIL/uL (ref 3.87–5.11)
RDW: 14.8 % (ref 11.5–15.5)
WBC: 9.4 10*3/uL (ref 4.0–10.5)
nRBC: 0 % (ref 0.0–0.2)

## 2019-01-07 LAB — LIPASE, BLOOD: Lipase: 31 U/L (ref 11–51)

## 2019-01-07 LAB — I-STAT BETA HCG BLOOD, ED (MC, WL, AP ONLY): I-stat hCG, quantitative: <5 m[IU]/mL (ref <5)

## 2019-01-07 MED ORDER — ALUM & MAG HYDROXIDE-SIMETH 200-200-20 MG/5ML PO SUSP
30.0000 mL | Freq: Once | ORAL | Status: AC
  Administered 2019-01-07: 30 mL via ORAL
  Filled 2019-01-07: qty 30

## 2019-01-07 MED ORDER — DICYCLOMINE HCL 10 MG/5ML PO SOLN
10.0000 mg | Freq: Once | ORAL | Status: DC
  Filled 2019-01-07: qty 5

## 2019-01-07 MED ORDER — DICYCLOMINE HCL 10 MG PO CAPS
10.0000 mg | ORAL_CAPSULE | Freq: Once | ORAL | Status: AC
  Administered 2019-01-07: 10 mg via ORAL
  Filled 2019-01-07: qty 1

## 2019-01-07 MED ORDER — OMEPRAZOLE 20 MG PO CPDR: 20.0000 mg | DELAYED_RELEASE_CAPSULE | Freq: Every day | ORAL | 0 refills | Status: DC

## 2019-01-07 MED ORDER — SODIUM CHLORIDE 0.9% FLUSH: 3.0000 mL | Freq: Once | INTRAVENOUS | Status: DC

## 2019-01-07 MED ORDER — LIDOCAINE VISCOUS HCL 2 % MT SOLN
15.0000 mL | Freq: Once | OROMUCOSAL | Status: AC
  Administered 2019-01-07: 15 mL via ORAL
  Filled 2019-01-07: qty 15

## 2019-01-07 NOTE — ED Provider Notes (Signed)
[facility] EMERGENCY DEPARTMENT Provider Note   CSN: [id number] Arrival date & time: 01/07/19  0403    History   Chief Complaint Chief Complaint  Patient presents with  . Abdominal Pain    HPI Debra Giles is a 38 y.o. female.     The history is provided by the patient.  Abdominal Pain  Pain location:  Epigastric and LUQ Pain quality: cramping   Pain radiates to:  Does not radiate Pain severity:  Severe Onset quality:  Sudden Timing:  Constant Progression:  Resolved Chronicity:  New Context: awakening from sleep   Context: not diet changes   Relieved by:  Nothing Worsened by:  Nothing Ineffective treatments:  None tried Associated symptoms: nausea   Associated symptoms: no anorexia, no belching, no chest pain, no chills, no constipation, no cough, no diarrhea, no dysuria, no fatigue, no fever, no flatus, no hematemesis, no hematochezia, no hematuria, no melena, no shortness of breath, no sore throat, no vaginal bleeding, no vaginal discharge and no vomiting   Risk factors: has not had multiple surgeries and not pregnant   No f/c/r.  No cough, no sore throat, no anosmia.  No sick contacts.  Ate cereal for dinner.  No flank pain.  No urinary symptoms.    Past Medical History:  Diagnosis Date  . Arthritis   . Hypertension   . Urticaria     There are no active problems to display for this patient.   Past Surgical History:  Procedure Laterality Date  . CESAREAN SECTION  02/20/2008  . HIP SURGERY Left 1997     OB History    Gravida      Para      Term      Preterm      AB      Living  1     SAB      TAB      Ectopic      Multiple      Live Births               Home Medications    Prior to Admission medications   Medication Sig Start Date End Date Taking? Authorizing Provider  citalopram (CELEXA) 20 MG tablet Take 10 mg by mouth daily.  03/25/18  Yes [provider]  lisinopril (PRINIVIL,ZESTRIL) 20 MG  tablet Take 20 mg by mouth daily.  03/25/18  Yes [provider]  omeprazole (PRILOSEC) 20 MG capsule Take 20 mg by mouth daily.  01/01/17  Yes [provider]  cetirizine (ZYRTEC) 10 MG tablet AM: 10-20 mg( 2 tabs), and PM 10-20 mg ( 2 tabs) Patient not taking: Reported on 01/07/2019 05/07/18   Alfonse Spruce, MD  montelukast (SINGULAIR) 10 MG tablet Take 1 tablet (10 mg total) by mouth at bedtime. Patient not taking: Reported on 05/31/2018 05/07/18   Alfonse Spruce, MD  montelukast (SINGULAIR) 10 MG tablet Take 1 tablet (10 mg total) by mouth at bedtime. Patient not taking: Reported on 01/07/2019 05/31/18   Alfonse Spruce, MD  ranitidine (ZANTAC) 300 MG tablet Take 1 tablet (300 mg total) by mouth 2 (two) times daily. Patient not taking: Reported on 05/31/2018 05/07/18   Alfonse Spruce, MD    Family History Family History  Problem Relation Age of Onset  . Frontotemporal dementia Mother        Primary Progressive Aphasia   . ADD / ADHD Brother     Social History Social  History   Tobacco Use  . Smoking status: Never Smoker  . Smokeless tobacco: Never Used  Substance Use Topics  . Alcohol use: No  . Drug use: No     Allergies   Percocet [oxycodone-acetaminophen]   Review of Systems Review of Systems  Constitutional: Negative for chills, fatigue and fever.  HENT: Negative for sore throat.   Respiratory: Negative for cough and shortness of breath.   Cardiovascular: Negative for chest pain.  Gastrointestinal: Positive for abdominal pain and nausea. Negative for anorexia, constipation, diarrhea, flatus, hematemesis, hematochezia, melena and vomiting.  Genitourinary: Negative for dysuria, hematuria, vaginal bleeding and vaginal discharge.  All other systems reviewed and are negative.    Physical Exam Updated Vital Signs BP (!) 144/92 (BP Location: Right Arm)   Pulse 84   Temp 98.1 F (36.7 C) (Oral)   Resp 18   Ht 5\' 5"  (1.651 m)   Wt  106.1 kg   SpO2 100%   BMI 38.94 kg/m   Physical Exam Vitals signs and nursing note reviewed.  Constitutional:      General: She is not in acute distress.    Appearance: Normal appearance.  HENT:     Head: Normocephalic and atraumatic.     Nose: Nose normal.  Eyes:     Conjunctiva/sclera: Conjunctivae normal.     Pupils: Pupils are equal, round, and reactive to light.  Neck:     Musculoskeletal: Normal range of motion and neck supple.  Cardiovascular:     Rate and Rhythm: Normal rate and regular rhythm.     Pulses: Normal pulses.     Heart sounds: Normal heart sounds.  Pulmonary:     Effort: Pulmonary effort is normal.     Breath sounds: Normal breath sounds. No wheezing or rales.  Abdominal:     General: Abdomen is flat. Bowel sounds are normal. There is no distension.     Palpations: There is no mass.     Tenderness: There is no abdominal tenderness. There is no guarding or rebound. Negative signs include Murphy's sign and Rovsing's sign.     Hernia: No hernia is present.  Musculoskeletal: Normal range of motion.  Skin:    General: Skin is warm and dry.     Capillary Refill: Capillary refill takes less than 2 seconds.  Neurological:     General: No focal deficit present.     Mental Status: She is alert and oriented to person, place, and time.  Psychiatric:        Mood and Affect: Mood normal.        Behavior: Behavior normal.      ED Treatments / Results  Labs (all labs ordered are listed, but only abnormal results are displayed) Labs Reviewed  CBC - Abnormal; Notable for the following components:      Result Value   Hemoglobin 11.5 (*)    MCV 77.8 (*)    MCH 23.9 (*)    All other components within normal limits  LIPASE, BLOOD  COMPREHENSIVE METABOLIC PANEL  URINALYSIS, ROUTINE W REFLEX MICROSCOPIC  I-STAT BETA HCG BLOOD, ED ([facility], [facility], [facility] ONLY)    EKG None  Radiology No results found.  Procedures Procedures (including critical care time)   Medications Ordered in ED Medications  sodium chloride flush (NS) 0.9 % injection 3 mL (has no administration in time range)  alum & mag hydroxide-simeth (MAALOX/MYLANTA) 200-200-20 MG/5ML suspension 30 mL (30 mLs Oral Given 01/07/19 0533)    And  lidocaine (  XYLOCAINE) 2 % viscous mouth solution 15 mL (15 mLs Oral Given 01/07/19 0533)  dicyclomine (BENTYL) capsule 10 mg (10 mg Oral Given 01/07/19 0532)     I suspect GERD as a source of this pain.  Symptoms resolved and patient is well appearing.  No indication for advanced imaging at this time.    Final Clinical Impressions(s) / ED Diagnoses    Return for intractable cough, coughing up blood,fevers >100.4 unrelieved by medication, shortness of breath, intractable vomiting, chest pain, shortness of breath, weakness,numbness, changes in speech, facial asymmetry,abdominal pain, passing out,Inability to tolerate liquids or food, cough, altered mental status or any concerns. No signs of systemic illness or infection. The patient is nontoxic-appearing on exam and vital signs are within normal limits.   I have reviewed the triage vital signs and the nursing notes. Pertinent labs &imaging results that were available during my care of the patient were reviewed by me and considered in my medical decision making (see chart for details).  After history, exam, and medical workup I feel the patient has been appropriately medically screened and is safe for discharge home. Pertinent diagnoses were discussed with the patient. Patient was given return precautions.   Zyona Pettaway, MD 01/07/19 [phone]

## 2019-01-07 NOTE — ED Triage Notes (Signed)
Pt reports upper abdominal pain that started last night around 9pm.  Pt states she is nauseas however no vomiting/loose stools.

## 2019-06-27 LAB — OB RESULTS CONSOLE GC/CHLAMYDIA
Chlamydia: NEGATIVE
Gonorrhea: NEGATIVE

## 2019-06-27 LAB — OB RESULTS CONSOLE HEPATITIS B SURFACE ANTIGEN: Hepatitis B Surface Ag: NEGATIVE

## 2019-06-27 LAB — OB RESULTS CONSOLE ANTIBODY SCREEN: Antibody Screen: NEGATIVE

## 2019-06-27 LAB — OB RESULTS CONSOLE ABO/RH: RH Type: POSITIVE

## 2019-06-27 LAB — OB RESULTS CONSOLE RUBELLA ANTIBODY, IGM: Rubella: NON-IMMUNE/NOT IMMUNE

## 2019-06-27 LAB — OB RESULTS CONSOLE RPR: RPR: NONREACTIVE

## 2019-06-27 LAB — OB RESULTS CONSOLE HIV ANTIBODY (ROUTINE TESTING): HIV: NONREACTIVE

## 2019-11-16 ENCOUNTER — Encounter: Payer: Self-pay | Admitting: Registered"

## 2019-11-16 ENCOUNTER — Encounter: Attending: Obstetrics and Gynecology | Admitting: Registered"

## 2019-11-16 ENCOUNTER — Other Ambulatory Visit: Payer: Self-pay

## 2019-11-16 DIAGNOSIS — O99810 Abnormal glucose complicating pregnancy: Secondary | ICD-10-CM | POA: Insufficient documentation

## 2019-11-16 NOTE — Progress Notes (Signed)
Patient was seen on 11/16/19 for Gestational Diabetes self-management class at [facility]. The following learning objectives were met by the patient during this course:   States the definition of Gestational Diabetes  States why dietary management is important in controlling blood glucose  Describes the effects each nutrient has on blood glucose levels  Demonstrates ability to create a balanced meal plan  Demonstrates carbohydrate counting   States when to check blood glucose levels  Demonstrates proper blood glucose monitoring techniques  States the effect of stress and exercise on blood glucose levels  States the importance of limiting caffeine and abstaining from alcohol and smoking  Blood glucose monitor given: none  Patient instructed to monitor glucose levels: FBS: 60 - <95; 1 hour: <140; 2 hour: <120  Patient received handouts:  Nutrition Diabetes and Pregnancy, including carb counting list  Patient will be seen for follow-up as needed.

## 2020-01-10 ENCOUNTER — Encounter (HOSPITAL_COMMUNITY): Payer: Self-pay | Admitting: *Deleted

## 2020-01-11 ENCOUNTER — Encounter (HOSPITAL_COMMUNITY): Payer: Self-pay

## 2020-01-11 NOTE — Patient Instructions (Signed)
Debra Giles  01/11/2020   Your procedure is scheduled on:  01/23/2020  Arrive at 1030 at Entrance C on [address] at [facility]  and [employer]. You are invited to use the FREE valet parking or use the Visitor's parking deck.  Pick up the phone at the desk and dial [phone].  Call this number if you have problems the morning of surgery: [phone]  Remember:   Do not eat food:(After Midnight) Desps de medianoche.  Do not drink clear liquids: (After Midnight) Desps de medianoche.  Take these medicines the morning of surgery with A SIP OF WATER:  Take your prilosec, celexa and labetalol as prescribed the day of surgery   Do not wear jewelry, make-up or nail polish.  Do not wear lotions, powders, or perfumes. Do not wear deodorant.  Do not shave 48 hours prior to surgery.  Do not bring valuables to the hospital.  [facility] is not   responsible for any belongings or valuables brought to the hospital.  Contacts, dentures or bridgework may not be worn into surgery.  Leave suitcase in the car. After surgery it may be brought to your room.  For patients admitted to the hospital, checkout time is 11:00 AM the day of              discharge.      Please read over the following fact sheets that you were given:     Preparing for Surgery

## 2020-01-16 ENCOUNTER — Inpatient Hospital Stay (HOSPITAL_COMMUNITY)
Admission: AD | Admit: 2020-01-16 | Discharge: 2020-01-20 | DRG: 788 | Disposition: A | Discharge status: Home / Self Care | Attending: Obstetrics and Gynecology | Admitting: Obstetrics and Gynecology

## 2020-01-16 ENCOUNTER — Inpatient Hospital Stay (HOSPITAL_COMMUNITY): Admitting: Certified Registered Nurse Anesthetist

## 2020-01-16 ENCOUNTER — Other Ambulatory Visit: Payer: Self-pay

## 2020-01-16 ENCOUNTER — Encounter (HOSPITAL_COMMUNITY): Payer: Self-pay | Admitting: Obstetrics and Gynecology

## 2020-01-16 ENCOUNTER — Encounter (HOSPITAL_COMMUNITY)
Admission: AD | Disposition: A | Discharge status: Home / Self Care | Payer: Self-pay | Source: Home / Self Care | Attending: Obstetrics and Gynecology

## 2020-01-16 DIAGNOSIS — O9902 Anemia complicating childbirth: Secondary | ICD-10-CM | POA: Diagnosis present

## 2020-01-16 DIAGNOSIS — O99214 Obesity complicating childbirth: Secondary | ICD-10-CM | POA: Diagnosis present

## 2020-01-16 DIAGNOSIS — O1002 Pre-existing essential hypertension complicating childbirth: Secondary | ICD-10-CM | POA: Diagnosis present

## 2020-01-16 DIAGNOSIS — O99344 Other mental disorders complicating childbirth: Secondary | ICD-10-CM | POA: Diagnosis present

## 2020-01-16 DIAGNOSIS — Z3A38 38 weeks gestation of pregnancy: Secondary | ICD-10-CM

## 2020-01-16 DIAGNOSIS — F419 Anxiety disorder, unspecified: Secondary | ICD-10-CM | POA: Diagnosis present

## 2020-01-16 DIAGNOSIS — O24420 Gestational diabetes mellitus in childbirth, diet controlled: Secondary | ICD-10-CM | POA: Diagnosis present

## 2020-01-16 DIAGNOSIS — D649 Anemia, unspecified: Secondary | ICD-10-CM | POA: Diagnosis present

## 2020-01-16 DIAGNOSIS — O34211 Maternal care for low transverse scar from previous cesarean delivery: Secondary | ICD-10-CM | POA: Diagnosis present

## 2020-01-16 DIAGNOSIS — Z20822 Contact with and (suspected) exposure to covid-19: Secondary | ICD-10-CM | POA: Diagnosis present

## 2020-01-16 DIAGNOSIS — O114 Pre-existing hypertension with pre-eclampsia, complicating childbirth: Secondary | ICD-10-CM | POA: Diagnosis present

## 2020-01-16 DIAGNOSIS — Z98891 History of uterine scar from previous surgery: Secondary | ICD-10-CM

## 2020-01-16 DIAGNOSIS — O26893 Other specified pregnancy related conditions, third trimester: Secondary | ICD-10-CM | POA: Diagnosis present

## 2020-01-16 LAB — COMPREHENSIVE METABOLIC PANEL
ALT: 20 U/L (ref 0–44)
AST: 18 U/L (ref 15–41)
Albumin: 2.4 g/dL — ABNORMAL LOW (ref 3.5–5.0)
Alkaline Phosphatase: 96 U/L (ref 38–126)
Anion gap: 9 (ref 5–15)
BUN: 5 mg/dL — ABNORMAL LOW (ref 6–20)
CO2: 21 mmol/L — ABNORMAL LOW (ref 22–32)
Calcium: 8.3 mg/dL — ABNORMAL LOW (ref 8.9–10.3)
Chloride: 107 mmol/L (ref 98–111)
Creatinine, Ser: 0.59 mg/dL (ref 0.44–1.00)
GFR calc Af Amer: >60 mL/min (ref >60)
GFR calc non Af Amer: >60 mL/min (ref >60)
Glucose, Bld: 80 mg/dL (ref 70–99)
Potassium: 3.4 mmol/L — ABNORMAL LOW (ref 3.5–5.1)
Sodium: 137 mmol/L (ref 135–145)
Total Bilirubin: 0.5 mg/dL (ref 0.3–1.2)
Total Protein: 5.6 g/dL — ABNORMAL LOW (ref 6.5–8.1)

## 2020-01-16 LAB — CBC
HCT: 29 % — ABNORMAL LOW (ref 36.0–46.0)
Hemoglobin: 8.8 g/dL — ABNORMAL LOW (ref 12.0–15.0)
MCH: 22.9 pg — ABNORMAL LOW (ref 26.0–34.0)
MCHC: 30.3 g/dL (ref 30.0–36.0)
MCV: 75.3 fL — ABNORMAL LOW (ref 80.0–100.0)
Platelets: 260 10*3/uL (ref 150–400)
RBC: 3.85 MIL/uL — ABNORMAL LOW (ref 3.87–5.11)
RDW: 18.2 % — ABNORMAL HIGH (ref 11.5–15.5)
WBC: 10.7 10*3/uL — ABNORMAL HIGH (ref 4.0–10.5)
nRBC: 0 % (ref 0.0–0.2)

## 2020-01-16 LAB — RESPIRATORY PANEL BY RT PCR (FLU A&B, COVID)
Influenza A by PCR: NEGATIVE
Influenza B by PCR: NEGATIVE
SARS Coronavirus 2 by RT PCR: NEGATIVE

## 2020-01-16 LAB — PREPARE RBC (CROSSMATCH)

## 2020-01-16 LAB — ABO/RH: ABO/RH(D): A POS

## 2020-01-16 LAB — PROTEIN / CREATININE RATIO, URINE
Creatinine, Urine: 217.29 mg/dL
Protein Creatinine Ratio: 0.43 mg/mg{Cre} — ABNORMAL HIGH (ref 0.00–0.15)
Total Protein, Urine: 94 mg/dL

## 2020-01-16 LAB — GLUCOSE, CAPILLARY: Glucose-Capillary: 81 mg/dL (ref 70–99)

## 2020-01-16 SURGERY — Surgical Case
Anesthesia: Spinal | Site: Abdomen | Wound class: Clean Contaminated

## 2020-01-16 MED ORDER — ACETAMINOPHEN 325 MG PO TABS: 650.0000 mg | ORAL_TABLET | ORAL | Status: DC | PRN

## 2020-01-16 MED ORDER — FAMOTIDINE IN NACL 20-0.9 MG/50ML-% IV SOLN
20.0000 mg | Freq: Once | INTRAVENOUS | Status: AC
  Administered 2020-01-16: 12:00:00 20 mg via INTRAVENOUS
  Filled 2020-01-16: qty 50

## 2020-01-16 MED ORDER — PROMETHAZINE HCL 25 MG/ML IJ SOLN
INTRAMUSCULAR | Status: AC
  Filled 2020-01-16: qty 1

## 2020-01-16 MED ORDER — CEFAZOLIN SODIUM-DEXTROSE 2-4 GM/100ML-% IV SOLN
2.0000 g | INTRAVENOUS | Status: AC
  Administered 2020-01-16: 2 g via INTRAVENOUS
  Filled 2020-01-16: qty 100

## 2020-01-16 MED ORDER — SODIUM CHLORIDE 0.9 % IR SOLN
Status: DC | PRN
  Administered 2020-01-16: 1000 mL

## 2020-01-16 MED ORDER — PHENYLEPHRINE HCL-NACL 20-0.9 MG/250ML-% IV SOLN
INTRAVENOUS | Status: DC | PRN
  Administered 2020-01-16: 60 ug/min via INTRAVENOUS

## 2020-01-16 MED ORDER — LABETALOL HCL 5 MG/ML IV SOLN: 80.0000 mg | INTRAVENOUS | Status: DC | PRN

## 2020-01-16 MED ORDER — MAGNESIUM SULFATE 40 GM/1000ML IV SOLN
INTRAVENOUS | Status: AC
  Filled 2020-01-16: qty 1000

## 2020-01-16 MED ORDER — NALOXONE HCL 0.4 MG/ML IJ SOLN: 0.4000 mg | INTRAMUSCULAR | Status: DC | PRN

## 2020-01-16 MED ORDER — HYDRALAZINE HCL 10 MG PO TABS
10.0000 mg | ORAL_TABLET | Freq: Four times a day (QID) | ORAL | Status: DC
  Filled 2020-01-16: qty 1

## 2020-01-16 MED ORDER — LABETALOL HCL 5 MG/ML IV SOLN
40.0000 mg | INTRAVENOUS | Status: DC | PRN
  Administered 2020-01-16: 12:00:00 40 mg via INTRAVENOUS
  Filled 2020-01-16: qty 8

## 2020-01-16 MED ORDER — PRENATAL MULTIVITAMIN CH
1.0000 | ORAL_TABLET | Freq: Every day | ORAL | Status: DC
  Administered 2020-01-17 – 2020-01-20 (×4): 1 via ORAL
  Filled 2020-01-16 (×4): qty 1

## 2020-01-16 MED ORDER — OXYTOCIN 40 UNITS IN NORMAL SALINE INFUSION - SIMPLE MED: 2.5000 [IU]/h | INTRAVENOUS | Status: AC

## 2020-01-16 MED ORDER — LABETALOL HCL 5 MG/ML IV SOLN
80.0000 mg | INTRAVENOUS | Status: DC | PRN
  Administered 2020-01-16: 13:00:00 80 mg via INTRAVENOUS
  Filled 2020-01-16: qty 16

## 2020-01-16 MED ORDER — PROMETHAZINE HCL 25 MG/ML IJ SOLN
6.2500 mg | Freq: Once | INTRAMUSCULAR | Status: AC
  Administered 2020-01-16: 16:00:00 6.25 mg via INTRAVENOUS

## 2020-01-16 MED ORDER — STERILE WATER FOR IRRIGATION IR SOLN
Status: DC | PRN
  Administered 2020-01-16: 1000 mL

## 2020-01-16 MED ORDER — KETOROLAC TROMETHAMINE 30 MG/ML IJ SOLN
30.0000 mg | Freq: Once | INTRAMUSCULAR | Status: AC | PRN
  Administered 2020-01-16: 15:00:00 30 mg via INTRAVENOUS

## 2020-01-16 MED ORDER — SOD CITRATE-CITRIC ACID 500-334 MG/5ML PO SOLN
30.0000 mL | Freq: Once | ORAL | Status: AC
  Administered 2020-01-16: 13:00:00 30 mL via ORAL
  Filled 2020-01-16: qty 30

## 2020-01-16 MED ORDER — LABETALOL HCL 5 MG/ML IV SOLN
20.0000 mg | INTRAVENOUS | Status: DC | PRN
  Administered 2020-01-16: 11:00:00 20 mg via INTRAVENOUS
  Filled 2020-01-16: qty 4

## 2020-01-16 MED ORDER — MAGNESIUM SULFATE BOLUS VIA INFUSION
4.0000 g | Freq: Once | INTRAVENOUS | Status: AC
  Administered 2020-01-16: 15:00:00 4 g via INTRAVENOUS
  Filled 2020-01-16: qty 1000

## 2020-01-16 MED ORDER — NALBUPHINE HCL 10 MG/ML IJ SOLN: 5.0000 mg | Freq: Once | INTRAMUSCULAR | Status: DC | PRN

## 2020-01-16 MED ORDER — LABETALOL HCL 200 MG PO TABS
200.0000 mg | ORAL_TABLET | Freq: Two times a day (BID) | ORAL | Status: DC
  Administered 2020-01-16 – 2020-01-17 (×3): 200 mg via ORAL
  Filled 2020-01-16 (×3): qty 1

## 2020-01-16 MED ORDER — BUPIVACAINE IN DEXTROSE 0.75-8.25 % IT SOLN
INTRATHECAL | Status: DC | PRN
  Administered 2020-01-16: 1.6 mL via INTRATHECAL

## 2020-01-16 MED ORDER — FENTANYL CITRATE (PF) 100 MCG/2ML IJ SOLN
INTRAMUSCULAR | Status: DC | PRN
  Administered 2020-01-16: 15 ug via INTRATHECAL

## 2020-01-16 MED ORDER — ONDANSETRON HCL 4 MG/2ML IJ SOLN
INTRAMUSCULAR | Status: AC
  Filled 2020-01-16: qty 2

## 2020-01-16 MED ORDER — LABETALOL HCL 100 MG PO TABS: 100.0000 mg | ORAL_TABLET | Freq: Two times a day (BID) | ORAL | Status: DC

## 2020-01-16 MED ORDER — SENNOSIDES-DOCUSATE SODIUM 8.6-50 MG PO TABS
2.0000 | ORAL_TABLET | ORAL | Status: DC
  Administered 2020-01-16 – 2020-01-18 (×2): 2 via ORAL
  Filled 2020-01-16 (×4): qty 2

## 2020-01-16 MED ORDER — HYDROMORPHONE HCL 2 MG PO TABS
2.0000 mg | ORAL_TABLET | ORAL | Status: DC | PRN
  Administered 2020-01-18 – 2020-01-20 (×2): 2 mg via ORAL
  Filled 2020-01-16 (×2): qty 1

## 2020-01-16 MED ORDER — DIPHENHYDRAMINE HCL 25 MG PO CAPS: 25.0000 mg | ORAL_CAPSULE | Freq: Four times a day (QID) | ORAL | Status: DC | PRN

## 2020-01-16 MED ORDER — ACETAMINOPHEN 10 MG/ML IV SOLN: 1000.0000 mg | Freq: Once | INTRAVENOUS | Status: DC | PRN

## 2020-01-16 MED ORDER — SODIUM CHLORIDE 0.9 % IV SOLN: INTRAVENOUS | Status: DC | PRN

## 2020-01-16 MED ORDER — FUROSEMIDE 10 MG/ML IJ SOLN
40.0000 mg | Freq: Once | INTRAMUSCULAR | Status: AC
  Administered 2020-01-16: 40 mg via INTRAVENOUS
  Filled 2020-01-16: qty 4

## 2020-01-16 MED ORDER — ZOLPIDEM TARTRATE 5 MG PO TABS: 5.0000 mg | ORAL_TABLET | Freq: Every evening | ORAL | Status: DC | PRN

## 2020-01-16 MED ORDER — MORPHINE SULFATE (PF) 0.5 MG/ML IJ SOLN
INTRAMUSCULAR | Status: DC | PRN
  Administered 2020-01-16: .15 mg via INTRATHECAL

## 2020-01-16 MED ORDER — LACTATED RINGERS IV SOLN: INTRAVENOUS | Status: DC

## 2020-01-16 MED ORDER — KETOROLAC TROMETHAMINE 30 MG/ML IJ SOLN: 30.0000 mg | Freq: Four times a day (QID) | INTRAMUSCULAR | Status: AC | PRN

## 2020-01-16 MED ORDER — FENTANYL CITRATE (PF) 100 MCG/2ML IJ SOLN
INTRAMUSCULAR | Status: AC
  Filled 2020-01-16: qty 2

## 2020-01-16 MED ORDER — MENTHOL 3 MG MT LOZG: 1.0000 | LOZENGE | OROMUCOSAL | Status: DC | PRN

## 2020-01-16 MED ORDER — SCOPOLAMINE 1 MG/3DAYS TD PT72
MEDICATED_PATCH | TRANSDERMAL | Status: AC
  Filled 2020-01-16: qty 1

## 2020-01-16 MED ORDER — IBUPROFEN 800 MG PO TABS
800.0000 mg | ORAL_TABLET | Freq: Three times a day (TID) | ORAL | Status: AC
  Administered 2020-01-16 – 2020-01-19 (×9): 800 mg via ORAL
  Filled 2020-01-16 (×9): qty 1

## 2020-01-16 MED ORDER — COCONUT OIL OIL: 1.0000 "application " | TOPICAL_OIL | Status: DC | PRN

## 2020-01-16 MED ORDER — LACTATED RINGERS IV BOLUS
500.0000 mL | Freq: Once | INTRAVENOUS | Status: AC
  Administered 2020-01-16: 500 mL via INTRAVENOUS

## 2020-01-16 MED ORDER — SODIUM CHLORIDE 0.9% IV SOLUTION: Freq: Once | INTRAVENOUS | Status: DC

## 2020-01-16 MED ORDER — LABETALOL HCL 5 MG/ML IV SOLN
20.0000 mg | INTRAVENOUS | Status: DC | PRN
  Administered 2020-01-17 – 2020-01-19 (×3): 20 mg via INTRAVENOUS
  Filled 2020-01-16 (×3): qty 4

## 2020-01-16 MED ORDER — DIBUCAINE (PERIANAL) 1 % EX OINT: 1.0000 "application " | TOPICAL_OINTMENT | CUTANEOUS | Status: DC | PRN

## 2020-01-16 MED ORDER — OXYTOCIN 40 UNITS IN NORMAL SALINE INFUSION - SIMPLE MED
INTRAVENOUS | Status: AC
  Filled 2020-01-16: qty 1000

## 2020-01-16 MED ORDER — NALBUPHINE HCL 10 MG/ML IJ SOLN: 5.0000 mg | INTRAMUSCULAR | Status: DC | PRN

## 2020-01-16 MED ORDER — ACETAMINOPHEN 500 MG PO TABS
1000.0000 mg | ORAL_TABLET | Freq: Four times a day (QID) | ORAL | Status: AC
  Administered 2020-01-16 – 2020-01-17 (×4): 1000 mg via ORAL
  Filled 2020-01-16 (×4): qty 2

## 2020-01-16 MED ORDER — ENOXAPARIN SODIUM 60 MG/0.6ML ~~LOC~~ SOLN
60.0000 mg | SUBCUTANEOUS | Status: DC
  Administered 2020-01-17 – 2020-01-20 (×3): 60 mg via SUBCUTANEOUS
  Filled 2020-01-16 (×4): qty 0.6

## 2020-01-16 MED ORDER — KETOROLAC TROMETHAMINE 30 MG/ML IJ SOLN
INTRAMUSCULAR | Status: AC
  Filled 2020-01-16: qty 1

## 2020-01-16 MED ORDER — WITCH HAZEL-GLYCERIN EX PADS: 1.0000 "application " | MEDICATED_PAD | CUTANEOUS | Status: DC | PRN

## 2020-01-16 MED ORDER — PHENYLEPHRINE HCL-NACL 20-0.9 MG/250ML-% IV SOLN
INTRAVENOUS | Status: AC
  Filled 2020-01-16: qty 250

## 2020-01-16 MED ORDER — OXYTOCIN 40 UNITS IN NORMAL SALINE INFUSION - SIMPLE MED
INTRAVENOUS | Status: DC | PRN
  Administered 2020-01-16: 40 [IU] via INTRAVENOUS

## 2020-01-16 MED ORDER — DIPHENHYDRAMINE HCL 25 MG PO CAPS: 25.0000 mg | ORAL_CAPSULE | ORAL | Status: DC | PRN

## 2020-01-16 MED ORDER — TETANUS-DIPHTH-ACELL PERTUSSIS 5-2.5-18.5 LF-MCG/0.5 IM SUSP: 0.5000 mL | Freq: Once | INTRAMUSCULAR | Status: DC

## 2020-01-16 MED ORDER — PHENYLEPHRINE HCL (PRESSORS) 10 MG/ML IV SOLN
INTRAVENOUS | Status: DC | PRN
  Administered 2020-01-16: 80 ug via INTRAVENOUS
  Administered 2020-01-16: 160 ug via INTRAVENOUS

## 2020-01-16 MED ORDER — SODIUM CHLORIDE 0.9% FLUSH
3.0000 mL | INTRAVENOUS | Status: DC | PRN
  Administered 2020-01-19: 21:00:00 3 mL via INTRAVENOUS

## 2020-01-16 MED ORDER — NALBUPHINE HCL 10 MG/ML IJ SOLN
5.0000 mg | INTRAMUSCULAR | Status: DC | PRN
  Administered 2020-01-16 – 2020-01-17 (×3): 5 mg via INTRAVENOUS
  Filled 2020-01-16 (×3): qty 1

## 2020-01-16 MED ORDER — FENTANYL CITRATE (PF) 100 MCG/2ML IJ SOLN
25.0000 ug | INTRAMUSCULAR | Status: DC | PRN
  Administered 2020-01-16: 50 ug via INTRAVENOUS
  Administered 2020-01-16: 15:00:00 25 ug via INTRAVENOUS

## 2020-01-16 MED ORDER — MAGNESIUM SULFATE 40 GM/1000ML IV SOLN
2.0000 g/h | INTRAVENOUS | Status: AC
  Administered 2020-01-17: 2 g/h via INTRAVENOUS
  Filled 2020-01-16: qty 1000

## 2020-01-16 MED ORDER — ONDANSETRON HCL 4 MG/2ML IJ SOLN: 4.0000 mg | Freq: Three times a day (TID) | INTRAMUSCULAR | Status: DC | PRN

## 2020-01-16 MED ORDER — CITALOPRAM HYDROBROMIDE 10 MG PO TABS
10.0000 mg | ORAL_TABLET | Freq: Every day | ORAL | Status: DC
  Administered 2020-01-16 – 2020-01-20 (×5): 10 mg via ORAL
  Filled 2020-01-16 (×5): qty 1

## 2020-01-16 MED ORDER — SIMETHICONE 80 MG PO CHEW
80.0000 mg | CHEWABLE_TABLET | ORAL | Status: DC
  Administered 2020-01-16 – 2020-01-18 (×3): 80 mg via ORAL
  Filled 2020-01-16 (×4): qty 1

## 2020-01-16 MED ORDER — NALOXONE HCL 4 MG/10ML IJ SOLN
1.0000 ug/kg/h | INTRAVENOUS | Status: DC | PRN
  Filled 2020-01-16: qty 5

## 2020-01-16 MED ORDER — SCOPOLAMINE 1 MG/3DAYS TD PT72
1.0000 | MEDICATED_PATCH | Freq: Once | TRANSDERMAL | Status: AC
  Administered 2020-01-16: 15:00:00 1.5 mg via TRANSDERMAL

## 2020-01-16 MED ORDER — SIMETHICONE 80 MG PO CHEW
80.0000 mg | CHEWABLE_TABLET | Freq: Three times a day (TID) | ORAL | Status: DC
  Administered 2020-01-16 – 2020-01-20 (×12): 80 mg via ORAL
  Filled 2020-01-16 (×12): qty 1

## 2020-01-16 MED ORDER — ONDANSETRON HCL 4 MG/2ML IJ SOLN
INTRAMUSCULAR | Status: DC | PRN
  Administered 2020-01-16: 4 mg via INTRAVENOUS

## 2020-01-16 MED ORDER — DIPHENHYDRAMINE HCL 50 MG/ML IJ SOLN
12.5000 mg | INTRAMUSCULAR | Status: DC | PRN
  Administered 2020-01-16: 12.5 mg via INTRAVENOUS
  Filled 2020-01-16: qty 1

## 2020-01-16 MED ORDER — HYDRALAZINE HCL 10 MG PO TABS
10.0000 mg | ORAL_TABLET | Freq: Four times a day (QID) | ORAL | Status: DC
  Filled 2020-01-16 (×2): qty 1

## 2020-01-16 MED ORDER — MORPHINE SULFATE (PF) 0.5 MG/ML IJ SOLN
INTRAMUSCULAR | Status: AC
  Filled 2020-01-16: qty 10

## 2020-01-16 MED ORDER — LABETALOL HCL 5 MG/ML IV SOLN
40.0000 mg | INTRAVENOUS | Status: DC | PRN
  Administered 2020-01-19: 10:00:00 40 mg via INTRAVENOUS
  Filled 2020-01-16: qty 8

## 2020-01-16 MED ORDER — SIMETHICONE 80 MG PO CHEW: 80.0000 mg | CHEWABLE_TABLET | ORAL | Status: DC | PRN

## 2020-01-16 MED ORDER — PANTOPRAZOLE SODIUM 40 MG PO TBEC
40.0000 mg | DELAYED_RELEASE_TABLET | Freq: Every day | ORAL | Status: DC
  Administered 2020-01-17 – 2020-01-20 (×4): 40 mg via ORAL
  Filled 2020-01-16 (×4): qty 1

## 2020-01-16 SURGICAL SUPPLY — 36 items
BENZOIN TINCTURE PRP APPL 2/3 (GAUZE/BANDAGES/DRESSINGS) ×2 IMPLANT
CHLORAPREP W/TINT 26ML (MISCELLANEOUS) ×3 IMPLANT
CLAMP CORD UMBIL (MISCELLANEOUS) ×2 IMPLANT
CLOSURE WOUND 1/2 X4 (GAUZE/BANDAGES/DRESSINGS) ×1
CLOTH BEACON ORANGE TIMEOUT ST (SAFETY) ×3 IMPLANT
DERMABOND ADVANCED (GAUZE/BANDAGES/DRESSINGS) ×2
DERMABOND ADVANCED .7 DNX12 (GAUZE/BANDAGES/DRESSINGS) ×1 IMPLANT
DRSG OPSITE POSTOP 4X10 (GAUZE/BANDAGES/DRESSINGS) ×3 IMPLANT
ELECT REM PT RETURN 9FT ADLT (ELECTROSURGICAL) ×3
ELECTRODE REM PT RTRN 9FT ADLT (ELECTROSURGICAL) ×1 IMPLANT
GAUZE SPONGE 4X4 12PLY STRL LF (GAUZE/BANDAGES/DRESSINGS) IMPLANT
GLOVE BIO SURGEON STRL SZ 6 (GLOVE) ×3 IMPLANT
GLOVE BIOGEL PI IND STRL 6.5 (GLOVE) ×1 IMPLANT
GLOVE BIOGEL PI IND STRL 7.0 (GLOVE) ×2 IMPLANT
GLOVE BIOGEL PI INDICATOR 6.5 (GLOVE) ×2
GLOVE BIOGEL PI INDICATOR 7.0 (GLOVE) ×4
GOWN STRL REUS W/TWL LRG LVL3 (GOWN DISPOSABLE) ×6 IMPLANT
NS IRRIG 1000ML POUR BTL (IV SOLUTION) ×3 IMPLANT
PACK C SECTION WH (CUSTOM PROCEDURE TRAY) ×3 IMPLANT
PAD ABD 8X10 STRL (GAUZE/BANDAGES/DRESSINGS) IMPLANT
PAD OB MATERNITY 4.3X12.25 (PERSONAL CARE ITEMS) ×3 IMPLANT
PENCIL SMOKE EVAC W/HOLSTER (ELECTROSURGICAL) ×3 IMPLANT
RTRCTR C-SECT PINK 25CM LRG (MISCELLANEOUS) ×2 IMPLANT
STRIP CLOSURE SKIN 1/2X4 (GAUZE/BANDAGES/DRESSINGS) ×1 IMPLANT
SUT PDS AB 0 CTX 36 PDP370T (SUTURE) IMPLANT
SUT PLAIN 2 0 (SUTURE) ×3
SUT PLAIN ABS 2-0 CT1 27XMFL (SUTURE) IMPLANT
SUT VIC AB 0 CT1 36 (SUTURE) ×6 IMPLANT
SUT VIC AB 2-0 CT1 27 (SUTURE) ×3
SUT VIC AB 2-0 CT1 TAPERPNT 27 (SUTURE) ×1 IMPLANT
SUT VIC AB 3-0 SH 27 (SUTURE)
SUT VIC AB 3-0 SH 27X BRD (SUTURE) IMPLANT
SUT VIC AB 4-0 KS 27 (SUTURE) ×3 IMPLANT
TOWEL OR 17X24 6PK STRL BLUE (TOWEL DISPOSABLE) ×3 IMPLANT
TRAY FOLEY W/BAG SLVR 14FR LF (SET/KITS/TRAYS/PACK) ×3 IMPLANT
WATER STERILE IRR 1000ML POUR (IV SOLUTION) ×3 IMPLANT

## 2020-01-16 NOTE — MAU Note (Signed)
High BP on Friday, went back for recheck today- BP was 184/104. Denies HA, is seeing spots- started in last hour, denies epigastric pain or increase in swelling.

## 2020-01-16 NOTE — MAU Note (Signed)
Dr Senaida Ores notified of patient's blood pressures. Gave bedside report at OR door to [employer]. Informed OR CRNA and RN of patient's last BP which was 176/92 and that  patient has received 3 doses of Labetalol per protocol and that there was no order for further medication.

## 2020-01-16 NOTE — Transfer of Care (Signed)
Immediate Anesthesia Transfer of Care Note  Patient: Debra Giles  Procedure(s) Performed: CESAREAN SECTION WITH BILATERAL TUBAL LIGATION (N/A Abdomen)  Patient Location: PACU  Anesthesia Type:Spinal  Level of Consciousness: awake, alert  and oriented  Airway & Oxygen Therapy: Patient Spontanous Breathing  Post-op Assessment: Report given to RN and Post -op Vital signs reviewed and stable  Post vital signs: Reviewed and stable  Last Vitals:  Vitals Value Taken Time  BP 161/91 01/16/20 1458  Temp    Pulse 72 01/16/20 1500  Resp 17 01/16/20 1500  SpO2 99 % 01/16/20 1500  Vitals shown include unvalidated device data.  Last Pain:  Vitals:   01/16/20 1045  TempSrc: Oral         Complications: No apparent anesthesia complications

## 2020-01-16 NOTE — Progress Notes (Signed)
Patient ID: Debra Giles, female   DOB: Oct 07, 1980, 39 y.o.   MRN: [id number] Pt having some itching from spinal.  Minimal pain.  Tolerating po  afeb BP 140-150/80-90's UOP this shift  Continue magnesium for 24 hours, not yet diuresing Recheck labs in AM, follow Hgb since was 8.8 preop On labetalol 200mg  po BID with the magnesium, but plans to bottlefeed and would like to go back on lisinopril ultimately, will convert back when magnesium d/c

## 2020-01-16 NOTE — Op Note (Signed)
Operative Note    Preoperative Diagnosis Term pregnancy at 38 2/7 weeks Chronic hypertension superimposed preeclampsia Prior c-section, declines TOL Gestational diabetes  Postoperative Diagnosis same  Procedure Repeat low transverse c-section with two layer closure  Surgeon Huel Cote, MD  Anesthesia Spinal   Fluids: EBL UOP 2mL clear IVF  Findings Viable female infant in vertex position.  Apgars 7,9 Weight pending.  Normal uterus tubes and ovaries  Specimen Placenta to L&D  Procedure Note Patient was taken to the operating room where spinal anesthesia was obtained and found to be adequate by Allis clamp test. She was prepped and draped in the normal sterile fashion in the dorsal supine position with a leftward tilt. An appropriate time out was performed. A Pfannenstiel skin incision was then made through a pre-existing scar with the scalpel and carried through to the underlying layer of fascia by sharp dissection and Bovie cautery. The fascia was nicked in the midline and the incision was extended laterally with Mayo scissors. The inferior aspect of the incision was grasped Coker clamps and dissected off the underlying rectus muscles. In a similar fashion the superior aspect was dissected off the rectus muscles. Rectus muscles were separated in the midline and the peritoneal cavity entered bluntly. The peritoneal incision was then extended both superiorly and inferiorly with careful attention to avoid both bowel and bladder. The Alexis self-retaining wound retractor was then placed within the incision and the lower uterine segment exposed. The bladder flap was developed with Metzenbaum scissors and pushed away from the lower uterine segment. The lower uterine segment was then incised in a transverse fashion and the cavity itself entered bluntly. The incision was extended bluntly. The infant's head was then lifted and delivered from the incision without  difficulty. The remainder of the infant delivered and the nose and mouth bulb suctioned with the cord clamped and cut as well. The infant was handed off to the waiting pediatricians. The placenta was then spontaneously expressed from the uterus and the uterus cleared of all clots and debris with moist lap sponge. The uterine incision was then repaired in 2 layers the first layer was a running locked layer of 1-0 chromic and the second an imbricating layer of the same suture. The tubes and ovaries were inspected and the gutters cleared of all clots and debris. The uterine incision was inspected and found to be hemostatic. All instruments and sponges as well as the Alexis retractor were then removed from the abdomen. The rectus muscles and peritoneum were then reapproximated with a running suture of 2-0 Vicryl. The fascia was then closed with 0 Vicryl in a running fashion. Subcutaneous tissue was reapproximated with 3-0 plain in a running fashion. The skin was closed with a subcuticular stitch of 4-0 Vicryl on a Keith needle and then reinforced with benzoin and Steri-Strips. At the conclusion of the procedure all instruments and sponge counts were correct. Patient was taken to the recovery room in good condition with her baby accompanying her skin to skin.  Patient will be started on magnesium in PACU

## 2020-01-16 NOTE — H&P (Signed)
Debra Giles is a 39 y.o. female G2P1001 at 77 2/7 weeks (EDD 01/28/20 byLMP c/w 9 week [place]) presenting from office with severe range BP.  She reported seeing some floaters, but no other major PIH sx or HA.  Prenatal care complicated by:  AMA --low risk panorama CHTN--had been stable on labetalol 100mg  po BID until last week, was on lisinopril prior to pregnancy Prior c-section-Desires Repeat. Declines TOL Obesity-on baby ASA GDM-stable on diet   OB History    Gravida  2   Para  1   Term      Preterm      AB      Living  1     SAB      TAB      Ectopic      Multiple      Live Births            02-20-2008, 38 wks M, 7lbs 8oz, Cesarean Section Breech, HELLP sx  Past Medical History:  Diagnosis Date  . Anemia   . Anxiety    celexa  . Arthritis   . Depression    mild PP depression no treatment  . Gestational diabetes   . History of HELLP syndrome, currently pregnant   . Hypertension   . Urticaria    Past Surgical History:  Procedure Laterality Date  . CESAREAN SECTION  02/20/2008  . HIP SURGERY Left 1997   Family History: family history includes ADD / ADHD in her brother; Frontotemporal dementia in her mother; Lupus in her brother. Social History:  reports that she has never smoked. She has never used smokeless tobacco. She reports that she does not drink alcohol or use drugs.     Maternal Diabetes: Yes:  Diabetes Type:  Diet controlled Genetic Screening: Normal Maternal Ultrasounds/Referrals: Normal Fetal Ultrasounds or other Referrals:  None Maternal Substance Abuse:  No Significant Maternal Medications:  Meds include: Other: labetalol Significant Maternal Lab Results:  None Other Comments:  None  Review of Systems  Gastrointestinal: Negative for abdominal pain.  Neurological: Negative for headaches.       Seeing spots   Maternal Medical History:  Contractions: Frequency: irregular.   Perceived severity is mild.    Fetal activity:  Perceived fetal activity is normal.    Prenatal complications: PIH.   AMA, CHTN, GDM, Obesity  Prenatal Complications - Diabetes: gestational. Diabetes is managed by diet.        Blood pressure (!) 171/101, pulse 88, temperature 98.7 F (37.1 C), temperature source Oral, resp. rate 18, height 5\' 5"  (1.651 m), weight 111.6 kg, last menstrual period 04/23/2019, SpO2 99 %. Maternal Exam:  Uterine Assessment: Contraction strength is mild.  Contraction frequency is irregular.   Abdomen: Patient reports no abdominal tenderness. Surgical scars: low transverse.   Introitus: Normal vulva. Normal vagina.    Physical Exam  Constitutional: She is oriented to person, place, and time. She appears well-developed.  Cardiovascular: Normal rate and regular rhythm.  Respiratory: Effort normal.  Genitourinary:    Vulva and vagina normal.   Musculoskeletal:        General: Edema present.  Neurological: She is alert and oriented to person, place, and time.    LABS Platelets 260K Creatinine 0.59 LFT's 18/20 Prot:creat ratio 0.43  Prenatal labs: ABO, Rh: A/Positive/-- (09/21 0000) Antibody: Negative (09/21 0000) Rubella: Nonimmune (09/21 0000) RPR: Nonreactive (09/21 0000)  HBsAg: Negative (09/21 0000)  HIV: Non-reactive (09/21 0000)  GBS:   Negative Essential panel negative  One hour GCT 216  Assessment/Plan: Pt presents with CHTN and new exacerbation to severe range BP's.  Labs ok except Prot:creat ratio up from her baseline of 0.19 last week.  D/w pt need to proceed with delivery and she wants to proceed with repeat c-section.  She has changed her mind about a tubal sterilization and plans a Mirena instead.  She has received labetalol in MAU several times and BP currently 150/80's.  D/w her given increased protein, floaters and sever range BP, we will proceed with magnesium x 24 hours postpartum.   The c-section was discussed with patient in detail including a review of the procedure,  discussion of risks including bleeding infection and possible damage to bowel and bladder.  We discussed given her starting anemia she is at increased risk of needing a transfusion and we will have 2 units of blood crossed and available.  Will proceed when OR available.    Logan Bores 01/16/2020, 11:12 AM

## 2020-01-16 NOTE — Lactation Note (Signed)
Lactation Consultation Note  Patient Name: Debra Giles [id number] Date: 01/16/2020   nothing about feeding preference in chart.  Mom reports plans to formula feed  Maternal Data    Feeding    LATCH Score                   Interventions    Lactation Tools Discussed/Used     Consult Status      Debra Giles 01/16/2020, 11:17 PM

## 2020-01-16 NOTE — MAU Provider Note (Signed)
First Provider Initiated Contact with Patient 01/16/20 1103    S Ms. Debra Giles is a 39 y.o. G2P1 non-pregnant female who presents to MAU today with complaint of high blood pressure, sent from office.   O BP (!) 171/101 (BP Location: Right Arm)   Pulse 88   Temp 98.7 F (37.1 C) (Oral)   Resp 18   Ht 5\' 5"  (1.651 m)   Wt 111.6 kg   LMP 04/23/2019   SpO2 99%   BMI 40.94 kg/m  Physical Exam  Nursing note and vitals reviewed. Constitutional: She is oriented to person, place, and time. She appears well-developed and well-nourished.  HENT:  Head: Normocephalic and atraumatic.  Eyes: Pupils are equal, round, and reactive to light. Conjunctivae and EOM are normal.  Cardiovascular: Normal rate, regular rhythm, normal heart sounds and intact distal pulses.  Respiratory: Effort normal and breath sounds normal.  GI: Soft. Bowel sounds are normal. She exhibits no distension and no mass. There is no abdominal tenderness. There is no rebound and no guarding.  gravid  Musculoskeletal:        General: Normal range of motion.     Cervical back: Normal range of motion and neck supple.  Neurological: She is alert and oriented to person, place, and time.  Skin: Skin is warm and dry.  Psychiatric: She has a normal mood and affect. Her behavior is normal. Judgment and thought content normal.    A [redacted] weeks gestation Severe range BP, worrisome for pre-E with Severe Features H/o Cesarean delivery  P Discussed with Dr. 04/25/2019, who will admit Pre-E labs ordered NPO since last night  Jerricka Carvey L, DO 01/16/2020 11:12 AM

## 2020-01-17 LAB — COMPREHENSIVE METABOLIC PANEL
ALT: 17 U/L (ref 0–44)
AST: 25 U/L (ref 15–41)
Albumin: 2 g/dL — ABNORMAL LOW (ref 3.5–5.0)
Alkaline Phosphatase: 92 U/L (ref 38–126)
Anion gap: 8 (ref 5–15)
BUN: 6 mg/dL (ref 6–20)
CO2: 24 mmol/L (ref 22–32)
Calcium: 7.3 mg/dL — ABNORMAL LOW (ref 8.9–10.3)
Chloride: 104 mmol/L (ref 98–111)
Creatinine, Ser: 0.83 mg/dL (ref 0.44–1.00)
GFR calc Af Amer: >60 mL/min (ref >60)
GFR calc non Af Amer: >60 mL/min (ref >60)
Glucose, Bld: 125 mg/dL — ABNORMAL HIGH (ref 70–99)
Potassium: 3 mmol/L — ABNORMAL LOW (ref 3.5–5.1)
Sodium: 136 mmol/L (ref 135–145)
Total Bilirubin: 0.6 mg/dL (ref 0.3–1.2)
Total Protein: 5 g/dL — ABNORMAL LOW (ref 6.5–8.1)

## 2020-01-17 LAB — CBC
HCT: 26.6 % — ABNORMAL LOW (ref 36.0–46.0)
Hemoglobin: 8 g/dL — ABNORMAL LOW (ref 12.0–15.0)
MCH: 23.2 pg — ABNORMAL LOW (ref 26.0–34.0)
MCHC: 30.1 g/dL (ref 30.0–36.0)
MCV: 77.1 fL — ABNORMAL LOW (ref 80.0–100.0)
Platelets: 251 10*3/uL (ref 150–400)
RBC: 3.45 MIL/uL — ABNORMAL LOW (ref 3.87–5.11)
RDW: 18.4 % — ABNORMAL HIGH (ref 11.5–15.5)
WBC: 9.2 10*3/uL (ref 4.0–10.5)
nRBC: 0 % (ref 0.0–0.2)

## 2020-01-17 LAB — BIRTH TISSUE RECOVERY COLLECTION (PLACENTA DONATION)

## 2020-01-17 MED ORDER — LISINOPRIL 10 MG PO TABS
20.0000 mg | ORAL_TABLET | Freq: Every day | ORAL | Status: DC
  Administered 2020-01-18: 20 mg via ORAL
  Filled 2020-01-17: qty 2

## 2020-01-17 MED ORDER — POTASSIUM CHLORIDE CRYS ER 20 MEQ PO TBCR
40.0000 meq | EXTENDED_RELEASE_TABLET | Freq: Two times a day (BID) | ORAL | Status: AC
  Administered 2020-01-17 (×2): 40 meq via ORAL
  Filled 2020-01-17 (×2): qty 2

## 2020-01-17 MED ORDER — FERROUS SULFATE 325 (65 FE) MG PO TABS
325.0000 mg | ORAL_TABLET | Freq: Two times a day (BID) | ORAL | Status: DC
  Administered 2020-01-17 – 2020-01-20 (×6): 325 mg via ORAL
  Filled 2020-01-17 (×6): qty 1

## 2020-01-17 NOTE — Progress Notes (Signed)
CSW acknowledged consult and completed chart review. CSW attempted to meet with MOB however MOB was on magnesium. CSW agreed to meet with MOB tomorrow.   Celso Sickle, LCSW Clinical Social Worker [facility] Cell#: [phone]

## 2020-01-17 NOTE — Progress Notes (Signed)
Notified of SRBP during earlier delivery (@ 2010), advised to given PM dose of labetalol and then treat. BP currently normotensive, pt denies all PreE symptoms. Based on chart review, was on Lisinopril 20mg  QD prior to pregnancy with good control. Ordered to begin tomorrow AM. Meeting postoperative milestones at this time BP 131/66 (BP Location: Left Arm)   Pulse 74   Temp 97.9 F (36.6 C) (Oral)   Resp 18   Ht 5\' 5"  (1.651 m)   Wt 111.6 kg   LMP 04/23/2019   SpO2 100%   Breastfeeding Unknown   BMI 40.94 kg/m

## 2020-01-17 NOTE — Anesthesia Procedure Notes (Signed)
Spinal  Patient location during procedure: OR Start time: 01/16/2020 1:29 PM End time: 01/16/2020 1:39 PM Staffing Performed: anesthesiologist  Anesthesiologist: Elmer Picker, MD Preanesthetic Checklist Completed: patient identified, IV checked, risks and benefits discussed, surgical consent, monitors and equipment checked, pre-op evaluation and timeout performed Spinal Block Patient position: sitting Prep: DuraPrep and site prepped and draped Patient monitoring: cardiac monitor, continuous pulse ox and blood pressure Approach: midline Location: L3-4 Injection technique: single-shot Needle Needle type: Pencan  Needle gauge: 24 G Needle length: 9 cm Assessment Sensory level: T6 Additional Notes Functioning IV was confirmed and monitors were applied. Sterile prep and drape, including hand hygiene and sterile gloves were used. The patient was positioned and the spine was prepped. The skin was anesthetized with lidocaine.  Free flow of clear CSF was obtained prior to injecting local anesthetic into the CSF.  The spinal needle aspirated freely following injection.  The needle was carefully withdrawn.  The patient tolerated the procedure well.

## 2020-01-17 NOTE — Progress Notes (Signed)
POSTPARTUM POSTOP PROGRESS NOTE  POD #1  Subjective:  No acute events overnight.  Pt denies problems with ambulating or po intake. Diuresed significantly overnight, s/p Foley at 0600 and has not yet voided. She denies nausea or vomiting.  Pain is well controlled.  She has not had flatus. She has not had bowel movement.  Lochia Minimal. Denies PreE symptoms but feels tired, likely 2/2 MgSO4. Baby girl in room, bottle feeding, sugars doing well! Reviewed again BP med history, will plan on starting lisinopril after MgSO4 completion  Objective: Blood pressure (!) 147/85, pulse 70, temperature 98.1 F (36.7 C), temperature source Oral, resp. rate 18, height 5\' 5"  (1.651 m), weight 111.6 kg, last menstrual period 04/23/2019, SpO2 99 %, unknown if currently breastfeeding.  Physical Exam:  General: alert, cooperative and no distress Lochia:normal flow Chest: CTAB Heart: RRR no m/r/g Abdomen: +BS, soft, nontender Uterine Fundus: firm, 2cm below umbilicus. Honeycomb dressing intact, minimal serosanguinous drainage Extremities: trace pedal BL edema, neg calf TTP BL, neg Homans BL  Recent Labs    01/16/20 1125 01/17/20 0527  HGB 8.8* 8.0*  HCT 29.0* 26.6*    Assessment/Plan:  ASSESSMENT: MIHIKA SURRETTE is a 39 y.o. G2P1002 s/p ERLTCS @ [redacted]w[redacted]d for h/o csx x1 w/ CHTN-SI-PreE. PNC c/b CHTN on Rx, GDMA1, obesity.  Postop: pending void and flatus. Start Fe/colace at this time given anemia (minimal drop from section). CHTN SI-PreE: Continue PO Lab 200mg  BID at this time, switch to Lisinopril after MgSO4 finishes at 1407. Lov 40SQ QD given postop, BMI and PreE status GDMA1: 2hrgtt at postpartum Hypokalemia: 3 on admission, asx. PO Klor-con ordered   LOS: 1 day

## 2020-01-17 NOTE — Anesthesia Preprocedure Evaluation (Signed)
Anesthesia Evaluation    Reviewed: Allergy & Precautions, Patient's Chart, lab work & pertinent test results  Airway Mallampati: III  TM Distance: >3 FB Neck ROM: Full    Dental no notable dental hx.    Pulmonary neg pulmonary ROS,    Pulmonary exam normal breath sounds clear to auscultation       Cardiovascular hypertension (preE on mag), Normal cardiovascular exam Rhythm:Regular Rate:Normal     Neuro/Psych PSYCHIATRIC DISORDERS Anxiety Depression negative neurological ROS     GI/Hepatic negative GI ROS, Neg liver ROS,   Endo/Other  diabetes, GestationalMorbid obesity (BMI 41)  Renal/GU negative Renal ROS  negative genitourinary   Musculoskeletal  (+) Arthritis ,   Abdominal   Peds  Hematology  (+) Blood dyscrasia (Hgb 8.8), anemia ,   Anesthesia Other Findings   Reproductive/Obstetrics (+) Pregnancy                             Anesthesia Physical Anesthesia Plan  ASA: III  Anesthesia Plan: Spinal   Post-op Pain Management:    Induction:   PONV Risk Score and Plan: 2 and Treatment may vary due to age or medical condition  Airway Management Planned: Natural Airway  Additional Equipment:   Intra-op Plan:   Post-operative Plan:   Informed Consent: I have reviewed the patients History and Physical, chart, labs and discussed the procedure including the risks, benefits and alternatives for the proposed anesthesia with the patient or authorized representative who has indicated his/her understanding and acceptance.     Dental advisory given  Plan Discussed with: CRNA  Anesthesia Plan Comments:         Anesthesia Quick Evaluation

## 2020-01-17 NOTE — Anesthesia Postprocedure Evaluation (Signed)
Anesthesia Post Note  Patient: Debra Giles  Procedure(s) Performed: CESAREAN SECTION WITH BILATERAL TUBAL LIGATION (N/A Abdomen)     Patient location during evaluation: PACU Anesthesia Type: Spinal Level of consciousness: oriented and awake and alert Pain management: pain level controlled Vital Signs Assessment: post-procedure vital signs reviewed and stable Respiratory status: spontaneous breathing, respiratory function stable and patient connected to nasal cannula oxygen Cardiovascular status: blood pressure returned to baseline and stable Postop Assessment: no headache, no backache and no apparent nausea or vomiting Anesthetic complications: no    Last Vitals:  Vitals:   01/17/20 0842 01/17/20 1212  BP: (!) 147/85 138/79  Pulse: 70   Resp: 18 18  Temp: 36.7 C 36.6 C  SpO2: 99% 98%    Last Pain:  Vitals:   01/17/20 1212  TempSrc: Axillary  PainSc:                  Antoine Vandermeulen L Cherith Tewell

## 2020-01-18 MED ORDER — LISINOPRIL 10 MG PO TABS
20.0000 mg | ORAL_TABLET | Freq: Once | ORAL | Status: AC
  Administered 2020-01-18: 20 mg via ORAL
  Filled 2020-01-18: qty 2

## 2020-01-18 MED ORDER — LISINOPRIL 10 MG PO TABS
40.0000 mg | ORAL_TABLET | Freq: Every day | ORAL | Status: DC
  Administered 2020-01-19: 09:00:00 40 mg via ORAL
  Filled 2020-01-18 (×2): qty 4

## 2020-01-18 NOTE — Progress Notes (Signed)
CSW received consult for hx of Anxiety and Depression.  CSW met with MOB to offer support and complete assessment.    CSW met with MOB at bedside to discuss consult for history of depression and anxiety, FOB present. MOB granted CSW verbal permission to speak in front of FOB about anything. CSW and MOB discussed MOB's mental health history, MOB reported that she was diagnosed with depression and anxiety in 2011. MOB reported that she has been taking Celexa since she was diagnosed in 2011 and it is helpful. MOB reported that her Celexa is currently managed by her OBGYN provider and it will be transferred back to her PCP to manage. MOB denied any current symptoms of anxiety and depression. MOB denied any history of postpartum depression. CSW inquired about how MOB is feeling emotionally after giving birth, MOB reported that she is feeling fine and ready to go home. MOB reported that she has all items needed to care for infant including a car seat and crib. CSW inquired about MOB's support system, MOB reported that her husband is her only support.   MOB presented calm and pleasant. MOB had insight about her mental health and did not demonstrate any acute mental health signs/symptoms. CSW assessed for safety, MOB denied SI and HI. CSW did not inquire about domestic violence because FOB was present for assessment.   CSW provided education regarding the baby blues period vs. perinatal mood disorders, discussed treatment and gave resources for mental health follow up if concerns arise.  CSW recommends self-evaluation during the postpartum time period using the New Mom Checklist from Postpartum Progress and encouraged MOB to contact a medical professional if symptoms are noted at any time.    CSW provided review of Sudden Infant Death Syndrome (SIDS) precautions.    CSW identifies no further need for intervention and no barriers to discharge at this time.  Emya Picado, LCSW Clinical Social Worker [facility] Cell#: [phone] 

## 2020-01-18 NOTE — Progress Notes (Signed)
Subjective: Postpartum Day 2: Cesarean Delivery Patient reports incisional pain and tolerating PO.  Nl lochia, pain controlled.  Desires d/c home today.  D/W pt elevated BP.  Start Lisinopril this am.  Will reassess at lunch or end of work day.    Objective: Vital signs in last 24 hours: Temp:  [97.8 F (36.6 C)-98.2 F (36.8 C)] 98.2 F (36.8 C) (04/14 0552) Pulse Rate:  [70-85] 85 (04/14 0807) Resp:  [18] 18 (04/14 0552) BP: (131-169)/(66-93) 160/93 (04/14 0807) SpO2:  [98 %-100 %] 100 % (04/14 0552)  Physical Exam:  General: alert and no distress Lochia: appropriate Uterine Fundus: firm Incision: healing well DVT Evaluation: No evidence of DVT seen on physical exam.  Recent Labs    01/16/20 1125 01/17/20 0527  HGB 8.8* 8.0*  HCT 29.0* 26.6*    Assessment/Plan: Status post Cesarean section. Doing well postoperatively.  Continue current care.  Jerilynn Feldmeier Bovard-Stuckert 01/18/2020, 8:11 AM

## 2020-01-19 MED ORDER — HYDROCHLOROTHIAZIDE 25 MG PO TABS
25.0000 mg | ORAL_TABLET | Freq: Every day | ORAL | Status: DC
  Administered 2020-01-19 – 2020-01-20 (×2): 25 mg via ORAL
  Filled 2020-01-19 (×2): qty 1

## 2020-01-19 MED ORDER — NIFEDIPINE ER OSMOTIC RELEASE 30 MG PO TB24
30.0000 mg | ORAL_TABLET | Freq: Every day | ORAL | Status: DC
  Administered 2020-01-19: 18:00:00 30 mg via ORAL
  Filled 2020-01-19: qty 1

## 2020-01-19 NOTE — Progress Notes (Signed)
Feels fine BP labile, 142-195/80-103, required IV labetalol  Discussed that her BP is not controlled well enough for me to approve discharge.  Will continue current Lisinopril and HCTZ, add Procardia XL 30 mg PO daily

## 2020-01-19 NOTE — Progress Notes (Signed)
Dr Jackelyn Knife notified of b/p 175/98, rechecked 15 minutes later 169 98.  No new orders.  Will re-check b/p in 1 hour.

## 2020-01-19 NOTE — Progress Notes (Signed)
POD #3 LTCS, CHTN with Preeclampsia Feels ok, pain ok Afeb, VSS, BP 140-180/70-90 Abd- soft, fundus firm, incision intact  Doing well except for HTN.  Lisinopril increased from 20 to 40 mg yesterday, BP still not optimally controlled.  Will add HCTZ and monitor.  Pt and husband are anxious to leave, have an 37yo child at home.  Discussed the importance of controlling BP and risks associated with elevated BP.  Will reevaluate for possible discharge this pm.

## 2020-01-20 LAB — BPAM RBC
Blood Product Expiration Date: 202105022359
Blood Product Expiration Date: 202105022359
Unit Type and Rh: 6200
Unit Type and Rh: 6200

## 2020-01-20 LAB — TYPE AND SCREEN
ABO/RH(D): A POS
Antibody Screen: NEGATIVE
Unit division: 0
Unit division: 0

## 2020-01-20 MED ORDER — ACETAMINOPHEN 325 MG PO TABS: 650.0000 mg | ORAL_TABLET | ORAL | 1 refills | Status: DC | PRN

## 2020-01-20 MED ORDER — NIFEDIPINE ER OSMOTIC RELEASE 30 MG PO TB24
60.0000 mg | ORAL_TABLET | Freq: Once | ORAL | Status: AC
  Administered 2020-01-20: 60 mg via ORAL
  Filled 2020-01-20 (×2): qty 2

## 2020-01-20 MED ORDER — LISINOPRIL 10 MG PO TABS
40.0000 mg | ORAL_TABLET | Freq: Every day | ORAL | Status: DC
  Administered 2020-01-20: 40 mg via ORAL
  Filled 2020-01-20: qty 4

## 2020-01-20 MED ORDER — CITALOPRAM HYDROBROMIDE 20 MG PO TABS: 20.0000 mg | ORAL_TABLET | Freq: Every day | ORAL | 1 refills | Status: DC

## 2020-01-20 MED ORDER — NIFEDIPINE ER 60 MG PO TB24: 60.0000 mg | ORAL_TABLET | Freq: Two times a day (BID) | ORAL | 1 refills | Status: DC

## 2020-01-20 MED ORDER — LISINOPRIL 40 MG PO TABS: 40.0000 mg | ORAL_TABLET | Freq: Every day | ORAL | 1 refills | Status: DC

## 2020-01-20 MED ORDER — ALPRAZOLAM 0.25 MG PO TABS
0.5000 mg | ORAL_TABLET | Freq: Two times a day (BID) | ORAL | Status: DC | PRN
  Administered 2020-01-20: 15:00:00 0.5 mg via ORAL
  Filled 2020-01-20: qty 2

## 2020-01-20 MED ORDER — NIFEDIPINE ER OSMOTIC RELEASE 30 MG PO TB24
60.0000 mg | ORAL_TABLET | Freq: Every day | ORAL | Status: DC
  Administered 2020-01-20: 09:00:00 60 mg via ORAL
  Filled 2020-01-20: qty 2

## 2020-01-20 MED ORDER — CITALOPRAM HYDROBROMIDE 20 MG PO TABS: 20.0000 mg | ORAL_TABLET | Freq: Every day | ORAL | Status: DC

## 2020-01-20 MED ORDER — HYDROCHLOROTHIAZIDE 25 MG PO TABS: 25.0000 mg | ORAL_TABLET | Freq: Every day | ORAL | 1 refills | Status: DC

## 2020-01-20 NOTE — Plan of Care (Signed)
  Problem: Coping: Goal: Level of anxiety will decrease Outcome: Progressing   Problem: Education: Goal: Knowledge of condition will improve Outcome: Progressing   Problem: Clinical Measurements: Goal: Respiratory complications will improve Outcome: Completed/Met   Problem: Activity: Goal: Risk for activity intolerance will decrease Outcome: Completed/Met   Problem: Nutrition: Goal: Adequate nutrition will be maintained Outcome: Completed/Met   Problem: Elimination: Goal: Will not experience complications related to bowel motility Outcome: Completed/Met Goal: Will not experience complications related to urinary retention Outcome: Completed/Met   Problem: Pain Managment: Goal: General experience of comfort will improve Outcome: Completed/Met   Problem: Education: Goal: Individualized Newborn Educational Video(s) Outcome: Completed/Met   Problem: Activity: Goal: Will verbalize the importance of balancing activity with adequate rest periods Outcome: Completed/Met Goal: Ability to tolerate increased activity will improve Outcome: Completed/Met   Problem: Role Relationship: Goal: Ability to demonstrate positive interaction with newborn will improve Outcome: Completed/Met

## 2020-01-20 NOTE — Discharge Summary (Signed)
OB Discharge Summary     Patient Name: Debra Giles DOB: [birth year] MRN: [id number]  Date of admission: 01/16/2020 Delivering MD: Paula Compton   Date of discharge: 01/20/2020  Admitting diagnosis: S/P repeat low transverse C-section [Z98.891] Intrauterine pregnancy: [redacted]w[redacted]d     Secondary diagnosis:  Active Problems:   S/P repeat low transverse C-section  Additional problems: Exacerbation of chronic hypertension/superimposed preeclampsia     Discharge diagnosis: Term Pregnancy Delivered                                                                                                Post partum procedures:postpartum magnesium and antihypertensive medications  Complications: None  Hospital course:  Sceduled C/S   39 y.o. yo G2P1002 at [redacted]w[redacted]d was admitted to the hospital 01/16/2020 for scheduled cesarean section with the following indication:Elective Repeat.  Membrane Rupture Time/Date: 2:06 PM ,01/16/2020   Patient delivered a Viable infant.01/16/2020  Details of operation can be found in separate operative note.  Patient had a postpartum course complicated by hypertension which took several medications to control.  On POD#4, she felt very well with no HA and no other sx.   She c/o increased anxiety related to not being able to go home and felt her elevated blood pressures were from that.  We discussed again why we wanted her blood pressure to be well-controlled prior to d/c and decrease risk of readmission, stroke, or other complications.  Her d/c blood pressures were 140-150/90-100 on lisinopril 40mg  po q AM, procardia XL 60mg  po BID and HCTZ 25mg  po q AM.  Althoug I preferred that she stay another night, I did not want her to leave AMA and we agreed upon close outpatient followup.  She has a blood pressure cuff at home and will contine to monitor it and call them into me over the weekend.  She knows if they become severe or she develops HA or other sx she needs readmission.    She was  ambulating, tolerating a regular diet, passing flatus, and urinating well. Patient is discharged home on  01/20/20 with close f/u as stated.  We also increased her celexa to 20mg  po daily.  She will return to office in 3-4 days and call in BP daily prior to that.          Physical exam  Vitals:   01/20/20 1337 01/20/20 1630 01/20/20 1800 01/20/20 1804  BP: (!) 154/94 (!) 157/97 (!) 159/113 (!) 148/100  Pulse: (!) 110 (!) 119 (!) 129 (!) 122  Resp:  18    Temp:  98.2 F (36.8 C)    TempSrc:  Oral    SpO2:  99%    Weight:      Height:       General: alert and cooperative Lochia: appropriate Uterine Fundus: firm Incision: Dressing is clean, dry, and intact DVT Evaluation: No evidence of DVT seen on physical exam. Labs: Lab Results  Component Value Date   WBC 9.2 01/17/2020   HGB 8.0 (L) 01/17/2020   HCT 26.6 (L) 01/17/2020   MCV 77.1 (L) 01/17/2020   PLT  251 01/17/2020   CMP Latest Ref Rng & Units 01/17/2020  Glucose 70 - 99 mg/dL 097(D)  BUN 6 - 20 mg/dL 6  Creatinine 5.32 - 9.92 mg/dL 4.26  Sodium 834 - 196 mmol/L 136  Potassium 3.5 - 5.1 mmol/L 3.0(L)  Chloride 98 - 111 mmol/L 104  CO2 22 - 32 mmol/L 24  Calcium 8.9 - 10.3 mg/dL 7.3(L)  Total Protein 6.5 - 8.1 g/dL 5.0(L)  Total Bilirubin 0.3 - 1.2 mg/dL 0.6  Alkaline Phos 38 - 126 U/L 92  AST 15 - 41 U/L 25  ALT 0 - 44 U/L 17    Discharge instruction: per After Visit Summary and "Baby and Me Booklet".  After visit meds:  Allergies as of 01/20/2020      Reactions   Percocet [oxycodone-acetaminophen] Itching   Pt can tolerate acetaminophen   Hydrocortisone Rash      Medication List    STOP taking these medications   labetalol 100 MG tablet Commonly known as: NORMODYNE   omeprazole 20 MG capsule Commonly known as: PRILOSEC     TAKE these medications   acetaminophen 325 MG tablet Commonly known as: TYLENOL Take 2 tablets (650 mg total) by mouth every 4 (four) hours as needed for mild pain (temperature >  101.5.).   citalopram 20 MG tablet Commonly known as: CELEXA Take 1 tablet (20 mg total) by mouth daily. Start taking on: January 21, 2020 What changed: how much to take   hydrochlorothiazide 25 MG tablet Commonly known as: HYDRODIURIL Take 1 tablet (25 mg total) by mouth daily. Start taking on: January 21, 2020   lisinopril 40 MG tablet Commonly known as: ZESTRIL Take 1 tablet (40 mg total) by mouth daily. Start taking on: January 21, 2020   NIFEdipine 60 MG 24 hr tablet Commonly known as: ADALAT CC Take 1 tablet (60 mg total) by mouth in the morning and at bedtime.   prenatal multivitamin Tabs tablet Take 1 tablet by mouth daily at 12 noon.       Diet: routine diet  Activity: Advance as tolerated. Pelvic rest for 6 weeks.   Outpatient follow up:5 days Follow up Appt: Future Appointments  Date Time Provider Department Center  01/21/2020  8:20 AM [facility] 1 [facility] None   Follow up Visit:No follow-ups on file.  Postpartum contraception: IUD Mirena  Newborn Data: Live born female  Birth Weight: 7 lb 1.1 oz (3205 g) APGAR: 7, 9  Newborn Delivery   Birth date/time: 01/16/2020 14:07:00 Delivery type: C-Section, Low Transverse Trial of labor: No C-section categorization: Repeat      Baby Feeding: Bottle Disposition:home with mother   01/20/2020 Oliver Pila, MD

## 2020-01-20 NOTE — Progress Notes (Signed)
Pt discharged to home with husband and newborn.  Condition stable.  Pt to car via wheelchair with T. Pulliam, NT.  No equipment for home ordered at discharge.

## 2020-01-20 NOTE — Progress Notes (Signed)
Patient ID: Debra Giles, female   DOB: June 17, 1981, 39 y.o.   MRN: [id number] CTSP by RN when she got very upset at BP not coming down and wanting to go home  Pt is very tearful and endorses high anxiety at present and thinks almost having panic attacks when BP being taken.  She has no childcare for her 39 y/o and really needs d/c this PM  Agree her anxiety is likely playing a part and we are going to treat with xanax 0.5 and give her a second dose of procardia XL 60mg  now.  We are also going to increase her celexa to 20mg   I advised her as long as BP does not get worse and is not in severe range this PM I will allow her to go home with close home f/u but advised her I cannot guarantee she will not need readmission and that it is not ideal.  Pt and husband agreeable.    Will recheck BP in 1-2 hours after meds

## 2020-01-20 NOTE — Progress Notes (Signed)
Subjective: Postpartum Day 4: Cesarean Delivery/exacerbation CHTN Patient reports minimal incisional pain and no problems voiding.  She has had no severe HA or PIH sx.  Just hoping for d/c today  Objective: Vital signs in last 24 hours: Temp:  [97.9 F (36.6 C)-98.6 F (37 C)] 98.3 F (36.8 C) (04/16 0809) Pulse Rate:  [74-109] 109 (04/16 0809) Resp:  [18-20] 18 (04/16 0809) BP: (142-195)/(78-103) 152/90 (04/16 0809) SpO2:  [98 %-100 %] 98 % (04/16 0809)  Physical Exam:  General: alert and cooperative Lochia: appropriate Uterine Fundus: firm Incision: clear but dressing partially coming off from moisture--will replace DVT Evaluation: none  No results for input(s): HGB, HCT in the last 72 hours.  Assessment/Plan: Status post Cesarean section. Postoperative course complicated by high blood pressures and titrating medications to control  Current regimen is lisinopril 40mg  po q AM, procardia XL 60mg  po q AM and HCTZ 25 mg, procardia just increased this AM and pt just received her meds in last hour.  Will see how BP runs on this regimen to assess for d/c later today Pain controlled on ibuprofen and tylenol.   Reviewed d/c instructions and incision care  01/20/2020, 9:27 AM

## 2020-01-20 NOTE — Progress Notes (Signed)
Patient ID: Debra Giles, female   DOB: 11-30-1980, 39 y.o.   MRN: [id number] Repeat BP this PM 140/100 and 150/90's  Pt feels very well with no HA and no other sx.   She c/o increased anxiety related to not being able to go home and felt her elevated blood pressures were from that.  We discussed again why we wanted her blood pressure to be well-controlled prior to d/c and decrease risk of readmission, stroke, or other complications.  Her d/c blood pressures were 140-150/90-100 on lisinopril 40mg  po q AM, procardia XL 60mg  po BID and HCTZ 25mg  po q AM.  Although I preferred that she stay another night, I do not want her to leave AMA and we agreed upon close outpatient followup.  She has a blood pressure cuff at home and will contine to monitor it and call them into me over the weekend.  She knows if they become severe or she develops HA or other sx she needs readmission.

## 2020-01-20 NOTE — Progress Notes (Signed)
BP stable overnight, will increase Procardia XL to 60 mg this am and see if get improved control

## 2020-01-21 ENCOUNTER — Other Ambulatory Visit (HOSPITAL_COMMUNITY)
Admission: RE | Admit: 2020-01-21 | Discharge: 2020-01-21 | Disposition: A | Discharge status: Home / Self Care | Source: Ambulatory Visit | Attending: Obstetrics and Gynecology | Admitting: Obstetrics and Gynecology

## 2020-01-21 HISTORY — DX: Anemia, unspecified: D64.9

## 2020-01-21 HISTORY — DX: Supervision of pregnancy with other poor reproductive or obstetric history, unspecified trimester: O09.299

## 2020-01-21 HISTORY — DX: Gestational diabetes mellitus in pregnancy, unspecified control: O24.419

## 2020-01-21 HISTORY — DX: Depression, unspecified: F32.A

## 2020-01-21 HISTORY — DX: Anxiety disorder, unspecified: F41.9

## 2020-01-23 ENCOUNTER — Inpatient Hospital Stay (HOSPITAL_COMMUNITY): Admit: 2020-01-23 | Admitting: Obstetrics and Gynecology

## 2020-03-05 LAB — HM PAP SMEAR: HPV Aptima: NEGATIVE

## 2020-03-14 DIAGNOSIS — M25552 Pain in left hip: Secondary | ICD-10-CM | POA: Insufficient documentation

## 2020-03-14 DIAGNOSIS — Z8632 Personal history of gestational diabetes: Secondary | ICD-10-CM | POA: Insufficient documentation

## 2020-03-14 DIAGNOSIS — G8929 Other chronic pain: Secondary | ICD-10-CM | POA: Insufficient documentation

## 2020-07-26 ENCOUNTER — Other Ambulatory Visit: Payer: Self-pay

## 2020-07-26 ENCOUNTER — Encounter: Payer: Self-pay | Admitting: Family Medicine

## 2020-07-26 ENCOUNTER — Ambulatory Visit (INDEPENDENT_AMBULATORY_CARE_PROVIDER_SITE_OTHER): Admitting: Family Medicine

## 2020-07-26 VITALS — BP 140/92 | HR 88 | Temp 98.3°F | Ht 65.0 in | Wt 226.0 lb

## 2020-07-26 DIAGNOSIS — M25552 Pain in left hip: Secondary | ICD-10-CM

## 2020-07-26 DIAGNOSIS — Z975 Presence of (intrauterine) contraceptive device: Secondary | ICD-10-CM

## 2020-07-26 DIAGNOSIS — I10 Essential (primary) hypertension: Secondary | ICD-10-CM

## 2020-07-26 DIAGNOSIS — F3342 Major depressive disorder, recurrent, in full remission: Secondary | ICD-10-CM

## 2020-07-26 DIAGNOSIS — G8929 Other chronic pain: Secondary | ICD-10-CM

## 2020-07-26 DIAGNOSIS — E039 Hypothyroidism, unspecified: Secondary | ICD-10-CM | POA: Insufficient documentation

## 2020-07-26 DIAGNOSIS — D500 Iron deficiency anemia secondary to blood loss (chronic): Secondary | ICD-10-CM | POA: Diagnosis not present

## 2020-07-26 DIAGNOSIS — Z8632 Personal history of gestational diabetes: Secondary | ICD-10-CM

## 2020-07-26 MED ORDER — TRAMADOL HCL 50 MG PO TABS: 50.0000 mg | ORAL_TABLET | Freq: Three times a day (TID) | ORAL | 0 refills | Status: AC | PRN

## 2020-07-26 MED ORDER — CITALOPRAM HYDROBROMIDE 40 MG PO TABS: 40.0000 mg | ORAL_TABLET | Freq: Every day | ORAL | Status: DC

## 2020-07-26 MED ORDER — LISINOPRIL-HYDROCHLOROTHIAZIDE 20-12.5 MG PO TABS: 2.0000 | ORAL_TABLET | Freq: Every day | ORAL | 3 refills | Status: DC

## 2020-07-26 NOTE — Patient Instructions (Signed)
It was so good seeing you again! Thank you for establishing with my new practice and allowing me to continue caring for you. It means a lot to me.   Please schedule a follow up appointment with me in 3 months for blood pressure follow up.   Stop the lisinopril and start the new combination pill: 2 tabs together daily in the morning.  Use the pain medication as needed.  Eat better! Less processed food and strength train.

## 2020-07-26 NOTE — Progress Notes (Signed)
Subjective  CC:  Chief Complaint  Patient presents with  . New Patient (Initial Visit)    [facility] [address], previous patient     HPI: Debra Giles is a 39 y.o. female is a former [facility] patient and is here to reestablish care with me today.    She has the following concerns or needs:  Feeling well. Taking medications w/o adverse effects. No symptoms of CHF, angina; no palpitations, sob, cp or lower extremity edema. Compliant with meds. bp runs borderline high though.  Reviewed ob notes: h/o gestational diabetes and chronic HTN. H/o anemia, postpartum. Needs f/u  Depression: doing very well on celexa. Happy married, has 72 yo son and 22 month old daughter   Chronic left hip pain: needs hip replacement. Basically just deals with pain. Rarely takes anything for it.   HM: up to date  Assessment  1. Essential hypertension   2. Iron deficiency anemia due to chronic blood loss   3. Recurrent major depressive disorder, in full remission (HCC)   4. Chronic left hip pain   5. History of gestational diabetes   6. IUD contraception      Plan   Htn: adjust meds, add hctz to ace and recheck in 3 months.   Depression is controlled. Refilled celexa  Discussed hip pain mgt. rec tramadol for prn use to help her be more active and this could help with weight loss/exercise.   Follow up:  3 mo bp check  No orders of the defined types were placed in this encounter.  Meds ordered this encounter  Medications  . lisinopril-hydrochlorothiazide (ZESTORETIC) 20-12.5 MG tablet    Sig: Take 2 tablets by mouth daily.    Dispense:  180 tablet    Refill:  3  . traMADol (ULTRAM) 50 MG tablet    Sig: Take 1 tablet (50 mg total) by mouth every 8 (eight) hours as needed for up to 5 days.    Dispense:  15 tablet    Refill:  0  . citalopram (CELEXA) 40 MG tablet    Sig: Take 1 tablet (40 mg total) by mouth daily.      We updated and reviewed the patient's past history in detail and it  is documented below.  Patient Active Problem List   Diagnosis Date Noted  . Hypothyroidism 07/26/2020  . Chronic left hip pain 03/14/2020  . Chronic left shoulder pain 03/14/2020  . History of gestational diabetes 03/14/2020  . Iron deficiency anemia due to chronic blood loss 09/25/2017  . Essential hypertension 06/06/2016    Formatting of this note might be different from the original. Dxd 2015; well controlled. Strong FH on HTN   . Gastroesophageal reflux disease without esophagitis 06/06/2016  . Class 2 severe obesity due to excess calories with serious comorbidity and body mass index (BMI) of 35.0 to 35.9 in adult [facility]) 06/06/2016    Formatting of this note might be different from the original. IMO 2019 R1.0 Update   . Recurrent major depressive disorder, in full remission (HCC) 06/06/2016  . IUD contraception 08/23/2012   Health Maintenance  Topic Date Due  . Hepatitis C Screening  Never done  . COVID-19 Vaccine (1) Never done  . PAP SMEAR-Modifier  Never done  . INFLUENZA VACCINE  01/03/2021 (Originally 05/06/2020)  . TETANUS/TDAP  03/25/2027  . HIV Screening  Completed   Immunization History  Administered Date(s) Administered  . Tdap 03/24/2017   Current Meds  Medication Sig  .  acetaminophen (TYLENOL) 325 MG tablet Take 2 tablets (650 mg total) by mouth every 4 (four) hours as needed for mild pain (temperature > 101.5.).  Marland Kitchen citalopram (CELEXA) 40 MG tablet Take 1 tablet (40 mg total) by mouth daily.  Marland Kitchen omeprazole (PRILOSEC) 20 MG capsule Take 20 mg by mouth daily.  . [DISCONTINUED] citalopram (CELEXA) 20 MG tablet Take 1 tablet (20 mg total) by mouth daily.  . [DISCONTINUED] lisinopril (ZESTRIL) 40 MG tablet Take 1 tablet (40 mg total) by mouth daily.    Allergies: Patient is allergic to percocet [oxycodone-acetaminophen] and hydrocortisone. Past Medical History Patient  has a past medical history of Anemia, Anxiety, Arthritis, Depression, Gestational diabetes,  History of HELLP syndrome, currently pregnant, Hypertension, and Urticaria. Past Surgical History Patient  has a past surgical history that includes Hip surgery (Left, 1997); Cesarean section (02/20/2008); and Cesarean section with bilateral tubal ligation (N/A, 01/16/2020). Family History: Patient family history includes ADD / ADHD in her brother; Frontotemporal dementia in her mother; Lupus in her brother. Social History:  Patient  reports that she has never smoked. She has never used smokeless tobacco. She reports that she does not drink alcohol and does not use drugs.  Review of Systems: Constitutional: negative for fever or malaise Ophthalmic: negative for photophobia, double vision or loss of vision Cardiovascular: negative for chest pain, dyspnea on exertion, or new LE swelling Respiratory: negative for SOB or persistent cough Gastrointestinal: negative for abdominal pain, change in bowel habits or melena Genitourinary: negative for dysuria or gross hematuria Musculoskeletal: negative for new gait disturbance or muscular weakness Integumentary: negative for new or persistent rashes Neurological: negative for TIA or stroke symptoms Psychiatric: negative for SI or delusions Allergic/Immunologic: negative for hives  Patient Care Team    Relationship Specialty Notifications Start End  Willow Ora, MD PCP - General Family Medicine  07/26/20     Objective  Vitals: BP (!) 140/92   Pulse 88   Temp 98.3 F (36.8 C) (Temporal)   Ht 5\' 5"  (1.651 m)   Wt 226 lb (102.5 kg)   SpO2 98%   BMI 37.61 kg/m  General:  Well developed, well nourished, no acute distress  Psych:  Alert and oriented,normal mood and affect HEENT:  Normocephalic, atraumatic, non-icteric sclera, PERRL, oropharynx is without mass or exudate, supple neck without adenopathy, mass or thyromegaly Cardiovascular:  RRR without gallop, rub or murmur, nondisplaced PMI Respiratory:  Good breath sounds bilaterally, CTAB  with normal respiratory effort Gastrointestinal: normal bowel sounds, soft, non-tender, no noted masses. No HSM MSK: no deformities, contusions. Joints are without erythema or swelling Skin:  Warm, no rashes or suspicious lesions noted Neurologic:    Mental status is normal. Gross motor and sensory exams are normal. Normal gait  Commons side effects, risks, benefits, and alternatives for medications and treatment plan prescribed today were discussed, and the patient expressed understanding of the given instructions. Patient is instructed to call or message via MyChart if he/she has any questions or concerns regarding our treatment plan. No barriers to understanding were identified. We discussed Red Flag symptoms and signs in detail. Patient expressed understanding regarding what to do in case of urgent or emergency type symptoms.   Medication list was reconciled, printed and provided to the patient in AVS. Patient instructions and summary information was reviewed with the patient as documented in the AVS. This note was prepared with assistance of Dragon voice recognition software. Occasional wrong-word or sound-a-like substitutions may have occurred due to the  inherent limitations of voice recognition software

## 2020-08-02 ENCOUNTER — Encounter: Payer: Self-pay | Admitting: Family Medicine

## 2020-10-31 ENCOUNTER — Ambulatory Visit: Admitting: Family Medicine

## 2020-11-09 ENCOUNTER — Other Ambulatory Visit: Payer: Self-pay

## 2020-11-09 ENCOUNTER — Ambulatory Visit (INDEPENDENT_AMBULATORY_CARE_PROVIDER_SITE_OTHER): Admitting: Family Medicine

## 2020-11-09 VITALS — BP 132/86 | HR 71 | Temp 98.2°F | Resp 18 | Wt 227.0 lb

## 2020-11-09 DIAGNOSIS — Z6835 Body mass index (BMI) 35.0-35.9, adult: Secondary | ICD-10-CM | POA: Diagnosis not present

## 2020-11-09 DIAGNOSIS — I10 Essential (primary) hypertension: Secondary | ICD-10-CM | POA: Diagnosis not present

## 2020-11-09 MED ORDER — AMLODIPINE BESYLATE 2.5 MG PO TABS: 2.5000 mg | ORAL_TABLET | Freq: Every day | ORAL | 2 refills | Status: DC

## 2020-11-09 MED ORDER — LISINOPRIL 40 MG PO TABS: 40.0000 mg | ORAL_TABLET | Freq: Every day | ORAL | 3 refills | Status: DC

## 2020-11-09 NOTE — Progress Notes (Signed)
Subjective  CC:  Chief Complaint  Patient presents with  . Follow-up  . Hypertension    HPI: Debra Giles is a 40 y.o. female who presents to the office today to address the problems listed above in the chief complaint. Hypertension f/u: Patient is now back on lisinopril 40 mg daily.  Her blood pressure runs borderline on this but mostly less than 140/90.  She had a significant hypertensive response to HCTZ.  She also felt fatigued.  She would prefer not to use this again if possible.  She tried taking 20/12.5 daily and was still hypotensive on that dose.  She has no chest pain or palpitations.  See last note Diet is poor because she tends to eat out most nights of the week, her son is in travel hockey.  Weight loss is less difficult.  Also has a hard time exercising due to chronic hip pain  Assessment  1. Essential hypertension   2. Class 2 severe obesity due to excess calories with serious comorbidity and body mass index (BMI) of 35.0 to 35.9 in adult [facility])      Plan    Hypertension f/u: BP control is fairly well controlled.  We will add low-dose amlodipine 2.5 mg due to lisinopril 40 mg daily.  Patient will check monitors at home.  She will send me numbers.  This should get her to goal.  Obesity f/u: Has multiple barriers to weight loss.  She will continue to try to make healthy choices while eating out. Education regarding management of these chronic disease states was given. Management strategies discussed on successive visits include dietary and exercise recommendations, goals of achieving and maintaining IBW, and lifestyle modifications aiming for adequate sleep and minimizing stressors.   Follow up: June for complete physical and blood pressure follow-up  No orders of the defined types were placed in this encounter.  Meds ordered this encounter  Medications  . amLODipine (NORVASC) 2.5 MG tablet    Sig: Take 1 tablet (2.5 mg total) by mouth daily.    Dispense:  30  tablet    Refill:  2  . lisinopril (ZESTRIL) 40 MG tablet    Sig: Take 1 tablet (40 mg total) by mouth daily.    Dispense:  90 tablet    Refill:  3        BP Readings from Last 3 Encounters:  11/09/20 132/86  07/26/20 (!) 140/92  01/20/20 (!) 148/100   Wt Readings from Last 3 Encounters:  11/09/20 227 lb (103 kg)  07/26/20 226 lb (102.5 kg)  01/11/20 243 lb (110.2 kg)    No results found for: CHOL No results found for: HDL No results found for: LDLCALC No results found for: TRIG No results found for: CHOLHDL No results found for: LDLDIRECT Lab Results  Component Value Date   CREATININE 0.83 01/17/2020   BUN 6 01/17/2020   NA 136 01/17/2020   K 3.0 (L) 01/17/2020   CL 104 01/17/2020   CO2 24 01/17/2020    The ASCVD Risk score (Goff DC Jr., et al., 2013) failed to calculate for the following reasons:   The 2013 ASCVD risk score is only valid for ages 56 to 25  I reviewed the patients updated PMH, FH, and SocHx.    Patient Active Problem List   Diagnosis Date Noted  . Hypothyroidism 07/26/2020  . Chronic left hip pain 03/14/2020  . Chronic left shoulder pain 03/14/2020  . History of gestational diabetes 03/14/2020  .  Iron deficiency anemia due to chronic blood loss 09/25/2017  . Essential hypertension 06/06/2016  . Gastroesophageal reflux disease without esophagitis 06/06/2016  . Class 2 severe obesity due to excess calories with serious comorbidity and body mass index (BMI) of 35.0 to 35.9 in adult (HCC) 06/06/2016  . Recurrent major depressive disorder, in full remission (HCC) 06/06/2016  . IUD contraception 08/23/2012    Allergies: Percocet [oxycodone-acetaminophen] and Hydrocortisone  Social History: Patient  reports that she has never smoked. She has never used smokeless tobacco. She reports that she does not drink alcohol and does not use drugs.  Current Meds  Medication Sig  . acetaminophen (TYLENOL) 325 MG tablet Take 2 tablets (650 mg total) by  mouth every 4 (four) hours as needed for mild pain (temperature > 101.5.).  Marland Kitchen amLODipine (NORVASC) 2.5 MG tablet Take 1 tablet (2.5 mg total) by mouth daily.  . citalopram (CELEXA) 40 MG tablet Take 1 tablet (40 mg total) by mouth daily.  Marland Kitchen omeprazole (PRILOSEC) 20 MG capsule Take 20 mg by mouth daily.  . [DISCONTINUED] lisinopril (ZESTRIL) 40 MG tablet Take 40 mg by mouth daily.    Review of Systems: Cardiovascular: negative for chest pain, palpitations, leg swelling, orthopnea Respiratory: negative for SOB, wheezing or persistent cough Gastrointestinal: negative for abdominal pain Genitourinary: negative for dysuria or gross hematuria  Objective  Vitals: BP 132/86   Pulse 71   Temp 98.2 F (36.8 C)   Resp 18   Wt 227 lb (103 kg)   SpO2 97%   BMI 37.77 kg/m  General: no acute distress  Psych:  Alert and oriented, normal mood and affect Cardiovascular:  RRR without murmur. no edema Respiratory:  Good breath sounds bilaterally, CTAB with normal respiratory effort   Commons side effects, risks, benefits, and alternatives for medications and treatment plan prescribed today were discussed, and the patient expressed understanding of the given instructions. Patient is instructed to call or message via MyChart if he/she has any questions or concerns regarding our treatment plan. No barriers to understanding were identified. We discussed Red Flag symptoms and signs in detail. Patient expressed understanding regarding what to do in case of urgent or emergency type symptoms.   Medication list was reconciled, printed and provided to the patient in AVS. Patient instructions and summary information was reviewed with the patient as documented in the AVS. This note was prepared with assistance of Dragon voice recognition software. Occasional wrong-word or sound-a-like substitutions may have occurred due to the inherent limitations of voice recognition software  This visit occurred during the  SARS-CoV-2 public health emergency.  Safety protocols were in place, including screening questions prior to the visit, additional usage of staff PPE, and extensive cleaning of exam room while observing appropriate contact time as indicated for disinfecting solutions.

## 2020-11-09 NOTE — Patient Instructions (Signed)
Please return in June for your annual complete physical; please come fasting and to recheck your blood pressure.  Add amlodipine to the daily lisinopril. Start checking your blood pressures intermittently again and send me in numbers in about a month.   If you have any questions or concerns, please don't hesitate to send me a message via MyChart or call the office at [phone]. Thank you for visiting with [place] today! It's our pleasure caring for you.

## 2021-01-21 ENCOUNTER — Ambulatory Visit: Payer: Self-pay | Admitting: Student

## 2021-01-30 ENCOUNTER — Telehealth: Payer: Self-pay

## 2021-01-30 ENCOUNTER — Other Ambulatory Visit: Payer: Self-pay

## 2021-01-30 MED ORDER — LISINOPRIL 40 MG PO TABS: 40.0000 mg | ORAL_TABLET | Freq: Every day | ORAL | 3 refills | Status: DC

## 2021-01-30 NOTE — Telephone Encounter (Signed)
Refill sent to pharmacy.   

## 2021-01-30 NOTE — Telephone Encounter (Signed)
MEDICATION: lisinopril 40 MG  PHARMACY: [facility] on [address]  Comments:   **Let patient know to contact pharmacy at the end of the day to make sure medication is ready. **  ** Please notify patient to allow 48-72 hours to process**  **Encourage patient to contact the pharmacy for refills or they can request refills through [facility]**

## 2021-02-17 ENCOUNTER — Other Ambulatory Visit: Payer: Self-pay | Admitting: Family Medicine

## 2021-02-18 ENCOUNTER — Telehealth: Payer: Self-pay

## 2021-02-18 ENCOUNTER — Other Ambulatory Visit: Payer: Self-pay | Admitting: Family Medicine

## 2021-02-18 ENCOUNTER — Other Ambulatory Visit: Payer: Self-pay

## 2021-02-18 MED ORDER — CITALOPRAM HYDROBROMIDE 40 MG PO TABS: 40.0000 mg | ORAL_TABLET | Freq: Every day | ORAL | 1 refills | Status: DC

## 2021-02-18 NOTE — Telephone Encounter (Signed)
..   LAST APPOINTMENT DATE: 11/09/20  NEXT APPOINTMENT DATE:@6 /13/2022  MEDICATION: citalopram  PHARMACY:[facility] [address]  Let patient know to contact pharmacy at the end of the day to make sure medication is ready.  Please notify patient to allow 48-72 hours to process  Encourage patient to contact the pharmacy for refills or they can request refills through [facility]  CLINICAL FILLS OUT ALL BELOW:   LAST REFILL:  QTY:  REFILL DATE:    OTHER COMMENTS:    Okay for refill?  Please advise

## 2021-02-26 ENCOUNTER — Ambulatory Visit: Payer: Self-pay | Admitting: Student

## 2021-02-26 NOTE — H&P (Signed)
TOTAL HIP ADMISSION H&P  Patient is admitted for left total hip arthroplasty.  Subjective:  Chief Complaint: left hip pain  HPI: Debra Giles, 40 y.o. female, has a history of pain and functional disability in the left hip(s) due to arthritis and patient has failed non-surgical conservative treatments for greater than 12 weeks to include NSAID's and/or analgesics and activity modification.  Onset of symptoms was gradual starting 3 years ago with gradually worsening course since that time.The patient noted prior procedures of the hip to include pinning on the left hip(s).  Patient currently rates pain in the left hip at 8 out of 10 with activity. Patient has worsening of pain with activity and weight bearing, pain that interfers with activities of daily living and pain with passive range of motion. Patient has evidence of subchondral cysts, subchondral sclerosis and joint space narrowing by imaging studies. This condition presents safety issues increasing the risk of falls. There is no current active infection.  Patient Active Problem List   Diagnosis Date Noted  . Hypothyroidism 07/26/2020  . Chronic left hip pain 03/14/2020  . Chronic left shoulder pain 03/14/2020  . History of gestational diabetes 03/14/2020  . Iron deficiency anemia due to chronic blood loss 09/25/2017  . Essential hypertension 06/06/2016  . Gastroesophageal reflux disease without esophagitis 06/06/2016  . Class 2 severe obesity due to excess calories with serious comorbidity and body mass index (BMI) of 35.0 to 35.9 in adult (HCC) 06/06/2016  . Recurrent major depressive disorder, in full remission (HCC) 06/06/2016  . IUD contraception 08/23/2012   Past Medical History:  Diagnosis Date  . Anemia   . Anxiety    celexa  . Arthritis   . Depression    mild PP depression no treatment  . Gestational diabetes   . History of HELLP syndrome, currently pregnant   . Hypertension   . Urticaria     Past Surgical  History:  Procedure Laterality Date  . CESAREAN SECTION  02/20/2008  . CESAREAN SECTION WITH BILATERAL TUBAL LIGATION N/A 01/16/2020   Procedure: CESAREAN SECTION WITH BILATERAL TUBAL LIGATION;  Surgeon: Huel Cote, MD;  Location: [facility];  Service: Obstetrics;  Laterality: N/A;  . HIP SURGERY Left 1997    Current Outpatient Medications  Medication Sig Dispense Refill Last Dose  . acetaminophen (TYLENOL) 325 MG tablet Take 2 tablets (650 mg total) by mouth every 4 (four) hours as needed for mild pain (temperature > 101.5.). (Patient not taking: No sig reported) 30 tablet 1   . amLODipine (NORVASC) 2.5 MG tablet TAKE 1 TABLET BY MOUTH EVERY DAY (Patient taking differently: Take 2.5 mg by mouth daily.) 90 tablet 3   . citalopram (CELEXA) 40 MG tablet TAKE 1 TABLET BY MOUTH EVERY DAY (Patient taking differently: Take 40 mg by mouth daily.) 90 tablet 1   . lisinopril (ZESTRIL) 40 MG tablet Take 1 tablet (40 mg total) by mouth daily. 90 tablet 3   . omeprazole (PRILOSEC) 20 MG capsule Take 20 mg by mouth in the morning and at bedtime.      No current facility-administered medications for this visit.   Allergies  Allergen Reactions  . Percocet [Oxycodone-Acetaminophen] Itching    Pt can tolerate acetaminophen  . Hydrocortisone Rash    Social History   Tobacco Use  . Smoking status: Never Smoker  . Smokeless tobacco: Never Used  Substance Use Topics  . Alcohol use: No    Family History  Problem Relation Age of Onset  .  Frontotemporal dementia Mother        Primary Progressive Aphasia   . ADD / ADHD Brother   . Lupus Brother      Review of Systems  Musculoskeletal: Positive for arthralgias.  All other systems reviewed and are negative.   Objective:  Physical Exam HENT:     Head: Normocephalic.  Eyes:     Pupils: Pupils are equal, round, and reactive to light.  Cardiovascular:     Rate and Rhythm: Normal rate.     Pulses: Normal pulses.  Pulmonary:     Effort:  Pulmonary effort is normal.  Abdominal:     Palpations: Abdomen is soft.     Tenderness: There is no abdominal tenderness.  Genitourinary:    Comments: Deferred Musculoskeletal:        General: Tenderness present.     Cervical back: Normal range of motion and neck supple.  Skin:    General: Skin is warm and dry.  Neurological:     Mental Status: She is alert and oriented to person, place, and time.  Psychiatric:        Mood and Affect: Mood normal.     Vital signs in last 24 hours: @VSRANGES@  Labs:   Estimated body mass index is 37.77 kg/m as calculated from the following:   Height as of 07/26/20: 5' 5" (1.651 m).   Weight as of 11/09/20: 103 kg.   Imaging Review Plain radiographs demonstrate severe degenerative joint disease of the left hip(s). The bone quality appears to be adequate for age and reported activity level.      Assessment/Plan:  End stage arthritis, left hip(s)  The patient history, physical examination, clinical judgement of the provider and imaging studies are consistent with end stage degenerative joint disease of the left hip(s) and total hip arthroplasty is deemed medically necessary. The treatment options including medical management, injection therapy, arthroscopy and arthroplasty were discussed at length. The risks and benefits of total hip arthroplasty were presented and reviewed. The risks due to aseptic loosening, infection, stiffness, dislocation/subluxation,  thromboembolic complications and other imponderables were discussed.  The patient acknowledged the explanation, agreed to proceed with the plan and consent was signed. Patient is being admitted for inpatient treatment for surgery, pain control, PT, OT, prophylactic antibiotics, VTE prophylaxis, progressive ambulation and ADL's and discharge planning.The patient is planning to be discharged home same day    

## 2021-02-26 NOTE — H&P (View-Only) (Signed)
TOTAL HIP ADMISSION H&P  Patient is admitted for left total hip arthroplasty.  Subjective:  Chief Complaint: left hip pain  HPI: Debra Giles, 40 y.o. female, has a history of pain and functional disability in the left hip(s) due to arthritis and patient has failed non-surgical conservative treatments for greater than 12 weeks to include NSAID's and/or analgesics and activity modification.  Onset of symptoms was gradual starting 3 years ago with gradually worsening course since that time.The patient noted prior procedures of the hip to include pinning on the left hip(s).  Patient currently rates pain in the left hip at 8 out of 10 with activity. Patient has worsening of pain with activity and weight bearing, pain that interfers with activities of daily living and pain with passive range of motion. Patient has evidence of subchondral cysts, subchondral sclerosis and joint space narrowing by imaging studies. This condition presents safety issues increasing the risk of falls. There is no current active infection.  Patient Active Problem List   Diagnosis Date Noted  . Hypothyroidism 07/26/2020  . Chronic left hip pain 03/14/2020  . Chronic left shoulder pain 03/14/2020  . History of gestational diabetes 03/14/2020  . Iron deficiency anemia due to chronic blood loss 09/25/2017  . Essential hypertension 06/06/2016  . Gastroesophageal reflux disease without esophagitis 06/06/2016  . Class 2 severe obesity due to excess calories with serious comorbidity and body mass index (BMI) of 35.0 to 35.9 in adult (HCC) 06/06/2016  . Recurrent major depressive disorder, in full remission (HCC) 06/06/2016  . IUD contraception 08/23/2012   Past Medical History:  Diagnosis Date  . Anemia   . Anxiety    celexa  . Arthritis   . Depression    mild PP depression no treatment  . Gestational diabetes   . History of HELLP syndrome, currently pregnant   . Hypertension   . Urticaria     Past Surgical  History:  Procedure Laterality Date  . CESAREAN SECTION  02/20/2008  . CESAREAN SECTION WITH BILATERAL TUBAL LIGATION N/A 01/16/2020   Procedure: CESAREAN SECTION WITH BILATERAL TUBAL LIGATION;  Surgeon: Huel Cote, MD;  Location: [facility];  Service: Obstetrics;  Laterality: N/A;  . HIP SURGERY Left 1997    Current Outpatient Medications  Medication Sig Dispense Refill Last Dose  . acetaminophen (TYLENOL) 325 MG tablet Take 2 tablets (650 mg total) by mouth every 4 (four) hours as needed for mild pain (temperature > 101.5.). (Patient not taking: No sig reported) 30 tablet 1   . amLODipine (NORVASC) 2.5 MG tablet TAKE 1 TABLET BY MOUTH EVERY DAY (Patient taking differently: Take 2.5 mg by mouth daily.) 90 tablet 3   . citalopram (CELEXA) 40 MG tablet TAKE 1 TABLET BY MOUTH EVERY DAY (Patient taking differently: Take 40 mg by mouth daily.) 90 tablet 1   . lisinopril (ZESTRIL) 40 MG tablet Take 1 tablet (40 mg total) by mouth daily. 90 tablet 3   . omeprazole (PRILOSEC) 20 MG capsule Take 20 mg by mouth in the morning and at bedtime.      No current facility-administered medications for this visit.   Allergies  Allergen Reactions  . Percocet [Oxycodone-Acetaminophen] Itching    Pt can tolerate acetaminophen  . Hydrocortisone Rash    Social History   Tobacco Use  . Smoking status: Never Smoker  . Smokeless tobacco: Never Used  Substance Use Topics  . Alcohol use: No    Family History  Problem Relation Age of Onset  .  Frontotemporal dementia Mother        Primary Progressive Aphasia   . ADD / ADHD Brother   . Lupus Brother      Review of Systems  Musculoskeletal: Positive for arthralgias.  All other systems reviewed and are negative.   Objective:  Physical Exam HENT:     Head: Normocephalic.  Eyes:     Pupils: Pupils are equal, round, and reactive to light.  Cardiovascular:     Rate and Rhythm: Normal rate.     Pulses: Normal pulses.  Pulmonary:     Effort:  Pulmonary effort is normal.  Abdominal:     Palpations: Abdomen is soft.     Tenderness: There is no abdominal tenderness.  Genitourinary:    Comments: Deferred Musculoskeletal:        General: Tenderness present.     Cervical back: Normal range of motion and neck supple.  Skin:    General: Skin is warm and dry.  Neurological:     Mental Status: She is alert and oriented to person, place, and time.  Psychiatric:        Mood and Affect: Mood normal.     Vital signs in last 24 hours: @VSRANGES @  Labs:   Estimated body mass index is 37.77 kg/m as calculated from the following:   Height as of 07/26/20: 5\' 5"  (1.651 m).   Weight as of 11/09/20: 103 kg.   Imaging Review Plain radiographs demonstrate severe degenerative joint disease of the left hip(s). The bone quality appears to be adequate for age and reported activity level.      Assessment/Plan:  End stage arthritis, left hip(s)  The patient history, physical examination, clinical judgement of the provider and imaging studies are consistent with end stage degenerative joint disease of the left hip(s) and total hip arthroplasty is deemed medically necessary. The treatment options including medical management, injection therapy, arthroscopy and arthroplasty were discussed at length. The risks and benefits of total hip arthroplasty were presented and reviewed. The risks due to aseptic loosening, infection, stiffness, dislocation/subluxation,  thromboembolic complications and other imponderables were discussed.  The patient acknowledged the explanation, agreed to proceed with the plan and consent was signed. Patient is being admitted for inpatient treatment for surgery, pain control, PT, OT, prophylactic antibiotics, VTE prophylaxis, progressive ambulation and ADL's and discharge planning.The patient is planning to be discharged home same day

## 2021-02-27 NOTE — Progress Notes (Signed)
DUE TO COVID-19 ONLY ONE VISITOR IS ALLOWED TO COME WITH YOU AND STAY IN THE WAITING ROOM ONLY DURING PRE OP AND PROCEDURE DAY OF SURGERY. THE 1 VISITOR  MAY VISIT WITH YOU AFTER SURGERY IN YOUR PRIVATE ROOM DURING VISITING HOURS ONLY!  YOU NEED TO HAVE A COVID 19 TEST ON_______ @_______ , THIS TEST MUST BE DONE BEFORE SURGERY,  COVID TESTING SITE [address] [place] , IT IS ON THE RIGHT GOING OUT [address] APPROXIMATELY  2 MINUTES PAST [employer] ON THE RIGHT. ONCE YOUR COVID TEST IS COMPLETED,  PLEASE BEGIN THE QUARANTINE INSTRUCTIONS AS OUTLINED IN YOUR HANDOUT.                Debra Giles  02/27/2021   Your procedure is scheduled on:        03/14/2021   Report to [facility] Main  Entrance   Report to admitting at     0515 AM     Call this number if you have problems the morning of surgery [phone]    REMEMBER: NO  SOLID FOOD CANDY OR GUM AFTER MIDNIGHT. CLEAR LIQUIDS UNTIL      0430am    . NOTHING BY MOUTH EXCEPT CLEAR LIQUIDS UNTIL     0430am   . PLEASE FINISH ENSURE DRINK PER SURGEON ORDER  WHICH NEEDS TO BE COMPLETED AT  0430am     .      CLEAR LIQUID DIET   Foods Allowed                                                                    Coffee and tea, regular and decaf                            Fruit ices (not with fruit pulp)                                      Iced Popsicles                                    Carbonated beverages, regular and diet                                    Cranberry, grape and apple juices Sports drinks like Gatorade Lightly seasoned clear broth or consume(fat free) Sugar, honey syrup ___________________________________________________________________      BRUSH YOUR TEETH MORNING OF SURGERY AND RINSE YOUR MOUTH OUT, NO CHEWING GUM CANDY OR MINTS.     Take these medicines the morning of surgery with A SIP OF WATER:         Celexa, amlodipine, prilosec  DO NOT TAKE ANY DIABETIC  MEDICATIONS DAY OF YOUR SURGERY                               You may not have any metal on your body including hair  pins and              piercings  Do not wear jewelry, make-up, lotions, powders or perfumes, deodorant             Do not wear nail polish on your fingernails.  Do not shave  48 hours prior to surgery.              Men may shave face and neck.   Do not bring valuables to the hospital. [place].  Contacts, dentures or bridgework may not be worn into surgery.  Leave suitcase in the car. After surgery it may be brought to your room.     Patients discharged the day of surgery will not be allowed to drive home. IF YOU ARE HAVING SURGERY AND GOING HOME THE SAME DAY, YOU MUST HAVE AN ADULT TO DRIVE YOU HOME AND BE WITH YOU FOR 24 HOURS. YOU MAY GO HOME BY TAXI OR UBER OR ORTHERWISE, BUT AN ADULT MUST ACCOMPANY YOU HOME AND STAY WITH YOU FOR 24 HOURS.  Name and phone number of your driver:  Special Instructions: N/A              Please read over the following fact sheets you were given: _____________________________________________________________________  [facility] - Preparing for Surgery Before surgery, you can play an important role.  Because skin is not sterile, your skin needs to be as free of germs as possible.  You can reduce the number of germs on your skin by washing with CHG (chlorahexidine gluconate) soap before surgery.  CHG is an antiseptic cleaner which kills germs and bonds with the skin to continue killing germs even after washing. Please DO NOT use if you have an allergy to CHG or antibacterial soaps.  If your skin becomes reddened/irritated stop using the CHG and inform your nurse when you arrive at Short Stay. Do not shave (including legs and underarms) for at least 48 hours prior to the first CHG shower.  You may shave your face/neck. Please follow these instructions carefully:  1.  Shower with CHG Soap the night  before surgery and the  morning of Surgery.  2.  If you choose to wash your hair, wash your hair first as usual with your  normal  shampoo.  3.  After you shampoo, rinse your hair and body thoroughly to remove the  shampoo.                           4.  Use CHG as you would any other liquid soap.  You can apply chg directly  to the skin and wash                       Gently with a scrungie or clean washcloth.  5.  Apply the CHG Soap to your body ONLY FROM THE NECK DOWN.   Do not use on face/ open                           Wound or open sores. Avoid contact with eyes, ears mouth and genitals (private parts).                       Wash face,  Genitals (private parts) with your  normal soap.             6.  Wash thoroughly, paying special attention to the area where your surgery  will be performed.  7.  Thoroughly rinse your body with warm water from the neck down.  8.  DO NOT shower/wash with your normal soap after using and rinsing off  the CHG Soap.                9.  Pat yourself dry with a clean towel.            10.  Wear clean pajamas.            11.  Place clean sheets on your bed the night of your first shower and do not  sleep with pets. Day of Surgery : Do not apply any lotions/deodorants the morning of surgery.  Please wear clean clothes to the hospital/surgery center.  FAILURE TO FOLLOW THESE INSTRUCTIONS MAY RESULT IN THE CANCELLATION OF YOUR SURGERY PATIENT SIGNATURE_________________________________  NURSE SIGNATURE__________________________________  ________________________________________________________________________

## 2021-03-06 ENCOUNTER — Other Ambulatory Visit: Payer: Self-pay

## 2021-03-06 ENCOUNTER — Encounter (HOSPITAL_COMMUNITY)
Admission: RE | Admit: 2021-03-06 | Discharge: 2021-03-06 | Disposition: A | Discharge status: Home / Self Care | Source: Ambulatory Visit | Attending: Orthopedic Surgery | Admitting: Orthopedic Surgery

## 2021-03-06 ENCOUNTER — Encounter (HOSPITAL_COMMUNITY): Payer: Self-pay

## 2021-03-06 DIAGNOSIS — Z01818 Encounter for other preprocedural examination: Secondary | ICD-10-CM | POA: Diagnosis present

## 2021-03-06 HISTORY — DX: Pneumonia, unspecified organism: J18.9

## 2021-03-06 HISTORY — DX: Other specified postprocedural states: Z98.890

## 2021-03-06 HISTORY — DX: Other specified postprocedural states: R11.2

## 2021-03-06 HISTORY — DX: Gastro-esophageal reflux disease without esophagitis: K21.9

## 2021-03-06 HISTORY — DX: Other complications of anesthesia, initial encounter: T88.59XA

## 2021-03-06 LAB — URINALYSIS, ROUTINE W REFLEX MICROSCOPIC
Bilirubin Urine: NEGATIVE
Glucose, UA: NEGATIVE mg/dL
Hgb urine dipstick: NEGATIVE
Ketones, ur: NEGATIVE mg/dL
Nitrite: NEGATIVE
Protein, ur: NEGATIVE mg/dL
Specific Gravity, Urine: 1.016 (ref 1.005–1.030)
pH: 6 (ref 5.0–8.0)

## 2021-03-06 LAB — CBC
HCT: 34.1 % — ABNORMAL LOW (ref 36.0–46.0)
Hemoglobin: 10.8 g/dL — ABNORMAL LOW (ref 12.0–15.0)
MCH: 24 pg — ABNORMAL LOW (ref 26.0–34.0)
MCHC: 31.7 g/dL (ref 30.0–36.0)
MCV: 75.8 fL — ABNORMAL LOW (ref 80.0–100.0)
Platelets: 307 10*3/uL (ref 150–400)
RBC: 4.5 MIL/uL (ref 3.87–5.11)
RDW: 15.3 % (ref 11.5–15.5)
WBC: 8.4 10*3/uL (ref 4.0–10.5)
nRBC: 0 % (ref 0.0–0.2)

## 2021-03-06 LAB — COMPREHENSIVE METABOLIC PANEL
ALT: 14 U/L (ref 0–44)
AST: 13 U/L — ABNORMAL LOW (ref 15–41)
Albumin: 3.6 g/dL (ref 3.5–5.0)
Alkaline Phosphatase: 49 U/L (ref 38–126)
Anion gap: 7 (ref 5–15)
BUN: 16 mg/dL (ref 6–20)
CO2: 28 mmol/L (ref 22–32)
Calcium: 9 mg/dL (ref 8.9–10.3)
Chloride: 102 mmol/L (ref 98–111)
Creatinine, Ser: 0.69 mg/dL (ref 0.44–1.00)
GFR, Estimated: >60 mL/min (ref >60)
Glucose, Bld: 101 mg/dL — ABNORMAL HIGH (ref 70–99)
Potassium: 3.6 mmol/L (ref 3.5–5.1)
Sodium: 137 mmol/L (ref 135–145)
Total Bilirubin: 0.5 mg/dL (ref 0.3–1.2)
Total Protein: 7.1 g/dL (ref 6.5–8.1)

## 2021-03-06 LAB — SURGICAL PCR SCREEN
MRSA, PCR: NEGATIVE
Staphylococcus aureus: NEGATIVE

## 2021-03-06 LAB — PROTIME-INR
INR: 0.9 (ref 0.8–1.2)
Prothrombin Time: 12 seconds (ref 11.4–15.2)

## 2021-03-06 NOTE — Progress Notes (Addendum)
Anesthesia Review:  PCP: DR Asencion Partridge  Cardiologist : none  Chest x-ray : EKG : 03/06/21  Echo : Stress test: Cardiac Cath :  Activity level: can do a flight of stairs wtihout diffiuculty  Sleep Study/ CPAP : none  Fasting Blood Sugar :      / Checks Blood Sugar -- times a day:   Blood Thinner/ Instructions /Last Dose: ASA / Instructions/ Last Dose : \ Had gestational diabetes no longer a diabetic per pt  hgba1c-03/06/21- 5.9  CBC and U/A  doen 03/06/21 routed to DR [facility]

## 2021-03-07 LAB — HEMOGLOBIN A1C
Hgb A1c MFr Bld: 5.9 % — ABNORMAL HIGH (ref 4.8–5.6)
Mean Plasma Glucose: 123 mg/dL

## 2021-03-13 NOTE — Anesthesia Preprocedure Evaluation (Addendum)
Anesthesia Evaluation  Patient identified by MRN, date of birth, ID band Patient awake    Reviewed: Allergy & Precautions, NPO status , Patient's Chart, lab work & pertinent test results  History of Anesthesia Complications (+) PONV  Airway Mallampati: II  TM Distance: >3 FB Neck ROM: Full    Dental  (+) Teeth Intact   Pulmonary neg pulmonary ROS, former smoker,    Pulmonary exam normal        Cardiovascular hypertension, Pt. on medications  Rhythm:Regular Rate:Normal     Neuro/Psych Anxiety Depression negative neurological ROS     GI/Hepatic Neg liver ROS, GERD  Medicated,  Endo/Other  diabetesHypothyroidism   Renal/GU negative Renal ROS  negative genitourinary   Musculoskeletal  (+) Arthritis , Osteoarthritis,    Abdominal (+)  Abdomen: soft. Bowel sounds: normal.  Peds  Hematology  (+) anemia ,   Anesthesia Other Findings   Reproductive/Obstetrics                           Anesthesia Physical Anesthesia Plan  ASA: II  Anesthesia Plan:    Post-op Pain Management:    Induction:   PONV Risk Score and Plan: 3 and Ondansetron, Dexamethasone, Propofol infusion, Midazolam, Treatment may vary due to age or medical condition and Scopolamine patch - Pre-op  Airway Management Planned: Simple Face Mask, Natural Airway and Nasal Cannula  Additional Equipment: None  Intra-op Plan:   Post-operative Plan:   Informed Consent: I have reviewed the patients History and Physical, chart, labs and discussed the procedure including the risks, benefits and alternatives for the proposed anesthesia with the patient or authorized representative who has indicated his/her understanding and acceptance.     Dental advisory given  Plan Discussed with: CRNA  Anesthesia Plan Comments: (Lab Results      Component                Value               Date                      WBC                       8.4                 03/06/2021                HGB                      10.8 (L)            03/06/2021                HCT                      34.1 (L)            03/06/2021                MCV                      75.8 (L)            03/06/2021                PLT  307                 03/06/2021           Lab Results      Component                Value               Date                      NA                       137                 03/06/2021                K                        3.6                 03/06/2021                CO2                      28                  03/06/2021                GLUCOSE                  101 (H)             03/06/2021                BUN                      16                  03/06/2021                CREATININE               0.69                03/06/2021                CALCIUM                  9.0                 03/06/2021                GFRNONAA                 >60                 03/06/2021                GFRAA                    >60                 01/17/2020          )       Anesthesia Quick Evaluation

## 2021-03-14 ENCOUNTER — Ambulatory Visit (HOSPITAL_COMMUNITY): Admitting: Physician Assistant

## 2021-03-14 ENCOUNTER — Encounter (HOSPITAL_COMMUNITY): Payer: Self-pay | Admitting: Orthopedic Surgery

## 2021-03-14 ENCOUNTER — Ambulatory Visit (HOSPITAL_COMMUNITY)

## 2021-03-14 ENCOUNTER — Encounter (HOSPITAL_COMMUNITY)
Admission: RE | Disposition: A | Discharge status: Home / Self Care | Payer: Self-pay | Source: Ambulatory Visit | Attending: Orthopedic Surgery

## 2021-03-14 ENCOUNTER — Ambulatory Visit (HOSPITAL_COMMUNITY): Admitting: Registered Nurse

## 2021-03-14 ENCOUNTER — Ambulatory Visit (HOSPITAL_COMMUNITY)
Admission: RE | Admit: 2021-03-14 | Discharge: 2021-03-14 | Disposition: A | Discharge status: Home / Self Care | Source: Ambulatory Visit | Attending: Orthopedic Surgery | Admitting: Orthopedic Surgery

## 2021-03-14 DIAGNOSIS — Z79899 Other long term (current) drug therapy: Secondary | ICD-10-CM | POA: Insufficient documentation

## 2021-03-14 DIAGNOSIS — Z885 Allergy status to narcotic agent status: Secondary | ICD-10-CM | POA: Insufficient documentation

## 2021-03-14 DIAGNOSIS — M1612 Unilateral primary osteoarthritis, left hip: Secondary | ICD-10-CM | POA: Diagnosis present

## 2021-03-14 DIAGNOSIS — M1652 Unilateral post-traumatic osteoarthritis, left hip: Secondary | ICD-10-CM | POA: Diagnosis not present

## 2021-03-14 DIAGNOSIS — Z09 Encounter for follow-up examination after completed treatment for conditions other than malignant neoplasm: Secondary | ICD-10-CM

## 2021-03-14 DIAGNOSIS — Z419 Encounter for procedure for purposes other than remedying health state, unspecified: Secondary | ICD-10-CM

## 2021-03-14 HISTORY — PX: TOTAL HIP ARTHROPLASTY: SHX124

## 2021-03-14 LAB — PREGNANCY, URINE: Preg Test, Ur: NEGATIVE

## 2021-03-14 LAB — TYPE AND SCREEN
ABO/RH(D): A POS
Antibody Screen: NEGATIVE

## 2021-03-14 SURGERY — ARTHROPLASTY, HIP, TOTAL, ANTERIOR APPROACH
Anesthesia: Spinal | Site: Hip | Laterality: Left

## 2021-03-14 MED ORDER — PHENYLEPHRINE HCL-NACL 10-0.9 MG/250ML-% IV SOLN
INTRAVENOUS | Status: AC
  Filled 2021-03-14: qty 250

## 2021-03-14 MED ORDER — HYDROCODONE-ACETAMINOPHEN 5-325 MG PO TABS
ORAL_TABLET | ORAL | Status: AC
  Filled 2021-03-14: qty 1

## 2021-03-14 MED ORDER — POVIDONE-IODINE 10 % EX SWAB: 2.0000 "application " | Freq: Once | CUTANEOUS | Status: DC

## 2021-03-14 MED ORDER — CEFAZOLIN SODIUM-DEXTROSE 2-4 GM/100ML-% IV SOLN
2.0000 g | INTRAVENOUS | Status: AC
  Administered 2021-03-14: 2 g via INTRAVENOUS
  Filled 2021-03-14: qty 100

## 2021-03-14 MED ORDER — SODIUM CHLORIDE (PF) 0.9 % IJ SOLN
INTRAMUSCULAR | Status: DC | PRN
  Administered 2021-03-14: 30 mL

## 2021-03-14 MED ORDER — LACTATED RINGERS IV BOLUS
500.0000 mL | Freq: Once | INTRAVENOUS | Status: AC
  Administered 2021-03-14: 500 mL via INTRAVENOUS

## 2021-03-14 MED ORDER — ONDANSETRON 8 MG PO TBDP: 8.0000 mg | ORAL_TABLET | Freq: Three times a day (TID) | ORAL | 0 refills | Status: DC | PRN

## 2021-03-14 MED ORDER — TRANEXAMIC ACID-NACL 1000-0.7 MG/100ML-% IV SOLN: 1000.0000 mg | Freq: Once | INTRAVENOUS | Status: DC

## 2021-03-14 MED ORDER — LACTATED RINGERS IV SOLN: INTRAVENOUS | Status: DC

## 2021-03-14 MED ORDER — ALBUMIN HUMAN 5 % IV SOLN
INTRAVENOUS | Status: AC
  Filled 2021-03-14: qty 250

## 2021-03-14 MED ORDER — EPHEDRINE SULFATE-NACL 50-0.9 MG/10ML-% IV SOSY
PREFILLED_SYRINGE | INTRAVENOUS | Status: DC | PRN
  Administered 2021-03-14: 10 mg via INTRAVENOUS

## 2021-03-14 MED ORDER — DEXAMETHASONE SODIUM PHOSPHATE 10 MG/ML IJ SOLN
INTRAMUSCULAR | Status: DC | PRN
  Administered 2021-03-14: 10 mg via INTRAVENOUS

## 2021-03-14 MED ORDER — FENTANYL CITRATE (PF) 100 MCG/2ML IJ SOLN
25.0000 ug | INTRAMUSCULAR | Status: DC | PRN
  Administered 2021-03-14: 50 ug via INTRAVENOUS

## 2021-03-14 MED ORDER — PHENYLEPHRINE 40 MCG/ML (10ML) SYRINGE FOR IV PUSH (FOR BLOOD PRESSURE SUPPORT)
PREFILLED_SYRINGE | INTRAVENOUS | Status: AC
  Filled 2021-03-14: qty 10

## 2021-03-14 MED ORDER — ACETAMINOPHEN 10 MG/ML IV SOLN
1000.0000 mg | Freq: Once | INTRAVENOUS | Status: DC | PRN
Start: 2021-03-14 — End: 2021-03-14

## 2021-03-14 MED ORDER — TRANEXAMIC ACID-NACL 1000-0.7 MG/100ML-% IV SOLN
1000.0000 mg | INTRAVENOUS | Status: AC
  Administered 2021-03-14: 1000 mg via INTRAVENOUS
  Filled 2021-03-14: qty 100

## 2021-03-14 MED ORDER — HYDROCODONE-ACETAMINOPHEN 5-325 MG PO TABS
1.0000 | ORAL_TABLET | ORAL | Status: DC | PRN
  Administered 2021-03-14: 1 via ORAL

## 2021-03-14 MED ORDER — DOCUSATE SODIUM 100 MG PO CAPS: 100.0000 mg | ORAL_CAPSULE | Freq: Two times a day (BID) | ORAL | 1 refills | Status: AC

## 2021-03-14 MED ORDER — PHENYLEPHRINE 40 MCG/ML (10ML) SYRINGE FOR IV PUSH (FOR BLOOD PRESSURE SUPPORT)
PREFILLED_SYRINGE | INTRAVENOUS | Status: DC | PRN
  Administered 2021-03-14: 80 ug via INTRAVENOUS
  Administered 2021-03-14: 160 ug via INTRAVENOUS

## 2021-03-14 MED ORDER — LIDOCAINE 2% (20 MG/ML) 5 ML SYRINGE
INTRAMUSCULAR | Status: AC
  Filled 2021-03-14: qty 5

## 2021-03-14 MED ORDER — HYDROCODONE-ACETAMINOPHEN 7.5-325 MG PO TABS: 1.0000 | ORAL_TABLET | ORAL | Status: DC | PRN

## 2021-03-14 MED ORDER — BUPIVACAINE-EPINEPHRINE (PF) 0.25% -1:200000 IJ SOLN
INTRAMUSCULAR | Status: AC
  Filled 2021-03-14: qty 30

## 2021-03-14 MED ORDER — METHOCARBAMOL 500 MG IVPB - SIMPLE MED
500.0000 mg | Freq: Four times a day (QID) | INTRAVENOUS | Status: DC | PRN
  Administered 2021-03-14: 500 mg via INTRAVENOUS

## 2021-03-14 MED ORDER — ONDANSETRON HCL 4 MG/2ML IJ SOLN
INTRAMUSCULAR | Status: AC
  Filled 2021-03-14: qty 2

## 2021-03-14 MED ORDER — FENTANYL CITRATE (PF) 100 MCG/2ML IJ SOLN
INTRAMUSCULAR | Status: AC
  Filled 2021-03-14: qty 2

## 2021-03-14 MED ORDER — MIDAZOLAM HCL 2 MG/2ML IJ SOLN
INTRAMUSCULAR | Status: AC
  Filled 2021-03-14: qty 2

## 2021-03-14 MED ORDER — SODIUM CHLORIDE 0.9 % IR SOLN
Status: DC | PRN
  Administered 2021-03-14: 1000 mL

## 2021-03-14 MED ORDER — LACTATED RINGERS IV BOLUS
250.0000 mL | Freq: Once | INTRAVENOUS | Status: AC
  Administered 2021-03-14: 250 mL via INTRAVENOUS

## 2021-03-14 MED ORDER — ALBUMIN HUMAN 5 % IV SOLN: INTRAVENOUS | Status: DC | PRN

## 2021-03-14 MED ORDER — APREPITANT 40 MG PO CAPS
40.0000 mg | ORAL_CAPSULE | Freq: Once | ORAL | Status: AC
  Administered 2021-03-14: 40 mg via ORAL
  Filled 2021-03-14: qty 1

## 2021-03-14 MED ORDER — ORAL CARE MOUTH RINSE: 15.0000 mL | Freq: Once | OROMUCOSAL | Status: AC

## 2021-03-14 MED ORDER — BUPIVACAINE IN DEXTROSE 0.75-8.25 % IT SOLN
INTRATHECAL | Status: DC | PRN
  Administered 2021-03-14: 1.6 mL via INTRATHECAL

## 2021-03-14 MED ORDER — BUPIVACAINE-EPINEPHRINE 0.25% -1:200000 IJ SOLN
INTRAMUSCULAR | Status: DC | PRN
  Administered 2021-03-14: 30 mL

## 2021-03-14 MED ORDER — SENNA 8.6 MG PO TABS: 2.0000 | ORAL_TABLET | Freq: Every day | ORAL | 1 refills | Status: AC

## 2021-03-14 MED ORDER — CEFAZOLIN SODIUM-DEXTROSE 2-4 GM/100ML-% IV SOLN: 2.0000 g | Freq: Four times a day (QID) | INTRAVENOUS | Status: DC

## 2021-03-14 MED ORDER — PROPOFOL 10 MG/ML IV BOLUS
INTRAVENOUS | Status: AC
  Filled 2021-03-14: qty 20

## 2021-03-14 MED ORDER — HYDROCODONE-ACETAMINOPHEN 5-325 MG PO TABS: 1.0000 | ORAL_TABLET | ORAL | 0 refills | Status: DC | PRN

## 2021-03-14 MED ORDER — FENTANYL CITRATE (PF) 100 MCG/2ML IJ SOLN
INTRAMUSCULAR | Status: DC | PRN
  Administered 2021-03-14: 100 ug via INTRAVENOUS

## 2021-03-14 MED ORDER — ACETAMINOPHEN 10 MG/ML IV SOLN
1000.0000 mg | Freq: Once | INTRAVENOUS | Status: AC
  Administered 2021-03-14: 1000 mg via INTRAVENOUS
  Filled 2021-03-14: qty 100

## 2021-03-14 MED ORDER — METHOCARBAMOL 500 MG IVPB - SIMPLE MED
INTRAVENOUS | Status: AC
  Filled 2021-03-14: qty 50

## 2021-03-14 MED ORDER — MIDAZOLAM HCL 5 MG/5ML IJ SOLN
INTRAMUSCULAR | Status: DC | PRN
  Administered 2021-03-14: 2 mg via INTRAVENOUS

## 2021-03-14 MED ORDER — PROPOFOL 1000 MG/100ML IV EMUL
INTRAVENOUS | Status: AC
  Filled 2021-03-14: qty 100

## 2021-03-14 MED ORDER — ASPIRIN 81 MG PO CHEW: 81.0000 mg | CHEWABLE_TABLET | Freq: Two times a day (BID) | ORAL | 0 refills | Status: AC

## 2021-03-14 MED ORDER — SODIUM CHLORIDE 0.9 % IV SOLN: INTRAVENOUS | Status: DC

## 2021-03-14 MED ORDER — CHLORHEXIDINE GLUCONATE 0.12 % MT SOLN
15.0000 mL | Freq: Once | OROMUCOSAL | Status: AC
  Administered 2021-03-14: 15 mL via OROMUCOSAL

## 2021-03-14 MED ORDER — ONDANSETRON HCL 4 MG/2ML IJ SOLN
INTRAMUSCULAR | Status: DC | PRN
  Administered 2021-03-14: 4 mg via INTRAVENOUS

## 2021-03-14 MED ORDER — EPHEDRINE 5 MG/ML INJ
INTRAVENOUS | Status: AC
  Filled 2021-03-14: qty 10

## 2021-03-14 MED ORDER — PROPOFOL 10 MG/ML IV BOLUS
INTRAVENOUS | Status: DC | PRN
  Administered 2021-03-14: 20 mg via INTRAVENOUS

## 2021-03-14 MED ORDER — ISOPROPYL ALCOHOL 70 % SOLN
Status: AC
  Filled 2021-03-14: qty 480

## 2021-03-14 MED ORDER — SODIUM CHLORIDE (PF) 0.9 % IJ SOLN
INTRAMUSCULAR | Status: AC
  Filled 2021-03-14: qty 30

## 2021-03-14 MED ORDER — MELOXICAM 15 MG PO TABS: 15.0000 mg | ORAL_TABLET | Freq: Every day | ORAL | 3 refills | Status: DC

## 2021-03-14 MED ORDER — DEXAMETHASONE SODIUM PHOSPHATE 10 MG/ML IJ SOLN
INTRAMUSCULAR | Status: AC
  Filled 2021-03-14: qty 1

## 2021-03-14 MED ORDER — KETOROLAC TROMETHAMINE 30 MG/ML IJ SOLN
INTRAMUSCULAR | Status: AC
  Filled 2021-03-14: qty 1

## 2021-03-14 MED ORDER — PROPOFOL 500 MG/50ML IV EMUL
INTRAVENOUS | Status: DC | PRN
  Administered 2021-03-14: 100 ug/kg/min via INTRAVENOUS

## 2021-03-14 MED ORDER — KETOROLAC TROMETHAMINE 30 MG/ML IJ SOLN
INTRAMUSCULAR | Status: DC | PRN
  Administered 2021-03-14: 30 mg via INTRAMUSCULAR

## 2021-03-14 MED ORDER — SCOPOLAMINE 1 MG/3DAYS TD PT72
1.0000 | MEDICATED_PATCH | TRANSDERMAL | Status: DC
  Administered 2021-03-14: 1.5 mg via TRANSDERMAL
  Filled 2021-03-14: qty 1

## 2021-03-14 MED ORDER — POVIDONE-IODINE 10 % EX SWAB
2.0000 "application " | Freq: Once | CUTANEOUS | Status: AC
  Administered 2021-03-14: 2 via TOPICAL

## 2021-03-14 MED ORDER — LIDOCAINE 2% (20 MG/ML) 5 ML SYRINGE
INTRAMUSCULAR | Status: DC | PRN
  Administered 2021-03-14: 40 mg via INTRAVENOUS

## 2021-03-14 MED ORDER — AMISULPRIDE (ANTIEMETIC) 5 MG/2ML IV SOLN: 10.0000 mg | Freq: Once | INTRAVENOUS | Status: DC | PRN

## 2021-03-14 MED ORDER — METHOCARBAMOL 500 MG PO TABS: 500.0000 mg | ORAL_TABLET | Freq: Four times a day (QID) | ORAL | Status: DC | PRN

## 2021-03-14 SURGICAL SUPPLY — 57 items
BALL HIP CERAMIC (Hips) ×1 IMPLANT
CHLORAPREP W/TINT 26 (MISCELLANEOUS) ×3 IMPLANT
COVER PERINEAL POST (MISCELLANEOUS) ×3 IMPLANT
COVER SURGICAL LIGHT HANDLE (MISCELLANEOUS) ×3 IMPLANT
COVER WAND RF STERILE (DRAPES) ×3 IMPLANT
DECANTER SPIKE VIAL GLASS SM (MISCELLANEOUS) ×3 IMPLANT
DERMABOND ADVANCED (GAUZE/BANDAGES/DRESSINGS) ×2
DERMABOND ADVANCED .7 DNX12 (GAUZE/BANDAGES/DRESSINGS) ×1 IMPLANT
DRAPE IMP U-DRAPE 54X76 (DRAPES) ×3 IMPLANT
DRAPE SHEET LG 3/4 BI-LAMINATE (DRAPES) ×9 IMPLANT
DRAPE STERI IOBAN 125X83 (DRAPES) IMPLANT
DRAPE U-SHAPE 47X51 STRL (DRAPES) ×6 IMPLANT
DRSG AQUACEL AG ADV 3.5X 6 (GAUZE/BANDAGES/DRESSINGS) ×3 IMPLANT
DRSG AQUACEL AG ADV 3.5X10 (GAUZE/BANDAGES/DRESSINGS) ×3 IMPLANT
ELECT REM PT RETURN 15FT ADLT (MISCELLANEOUS) ×3 IMPLANT
GAUZE SPONGE 4X4 12PLY STRL (GAUZE/BANDAGES/DRESSINGS) ×3 IMPLANT
GLOVE SRG 8 PF TXTR STRL LF DI (GLOVE) ×1 IMPLANT
GLOVE SURG ENC MOIS LTX SZ8.5 (GLOVE) ×6 IMPLANT
GLOVE SURG ENC TEXT LTX SZ7.5 (GLOVE) ×6 IMPLANT
GLOVE SURG UNDER POLY LF SZ8 (GLOVE) ×3
GLOVE SURG UNDER POLY LF SZ8.5 (GLOVE) ×3 IMPLANT
GOWN SPEC L3 XXLG W/TWL (GOWN DISPOSABLE) ×3 IMPLANT
GOWN STRL REUS W/TWL XL LVL3 (GOWN DISPOSABLE) ×3 IMPLANT
HANDPIECE INTERPULSE COAX TIP (DISPOSABLE) ×3
HIP BALL CERAMIC (Hips) ×3 IMPLANT
HOLDER FOLEY CATH W/STRAP (MISCELLANEOUS) ×3 IMPLANT
HOOD PEEL AWAY FLYTE STAYCOOL (MISCELLANEOUS) ×12 IMPLANT
KIT TURNOVER KIT A (KITS) ×3 IMPLANT
MANIFOLD NEPTUNE II (INSTRUMENTS) ×3 IMPLANT
MARKER SKIN DUAL TIP RULER LAB (MISCELLANEOUS) ×3 IMPLANT
NDL SAFETY ECLIPSE 18X1.5 (NEEDLE) ×1 IMPLANT
NEEDLE HYPO 18GX1.5 SHARP (NEEDLE) ×3
NEEDLE SPNL 18GX3.5 QUINCKE PK (NEEDLE) ×3 IMPLANT
PACK ANTERIOR HIP CUSTOM (KITS) ×3 IMPLANT
PENCIL SMOKE EVACUATOR (MISCELLANEOUS) IMPLANT
PIN ALTRX NEUTRAL X OD 32X52 (Pin) ×3 IMPLANT
PIN SECTOR W/GRIP ACE CUP 52MM (Hips) ×3 IMPLANT
SAW OSC TIP CART 19.5X105X1.3 (SAW) ×3 IMPLANT
SEALER BIPOLAR AQUA 6.0 (INSTRUMENTS) ×3 IMPLANT
SET HNDPC FAN SPRY TIP SCT (DISPOSABLE) ×1 IMPLANT
STEM TRI LOC BPS SZ3 W GRIPTON ×1 IMPLANT
SUT ETHIBOND NAB CT1 #1 30IN (SUTURE) ×6 IMPLANT
SUT MNCRL AB 3-0 PS2 18 (SUTURE) ×6 IMPLANT
SUT MNCRL AB 4-0 PS2 18 (SUTURE) ×6 IMPLANT
SUT MON AB 2-0 CT1 36 (SUTURE) ×15 IMPLANT
SUT STRATAFIX PDO 1 14 VIOLET (SUTURE) ×3
SUT STRATFX PDO 1 14 VIOLET (SUTURE) ×1
SUT VIC AB 1 CTX 36 (SUTURE) ×3
SUT VIC AB 1 CTX36XBRD ANBCTR (SUTURE) ×1 IMPLANT
SUT VIC AB 2-0 CT1 27 (SUTURE) ×3
SUT VIC AB 2-0 CT1 TAPERPNT 27 (SUTURE) ×1 IMPLANT
SUTURE STRATFX PDO 1 14 VIOLET (SUTURE) ×1 IMPLANT
SYR 3ML LL SCALE MARK (SYRINGE) ×3 IMPLANT
TRAY FOLEY MTR SLVR 16FR STAT (SET/KITS/TRAYS/PACK) ×3 IMPLANT
TRI LOC BPS SZ 3 W GRIPTON ×3 IMPLANT
TUBE SUCTION HIGH CAP CLEAR NV (SUCTIONS) ×3 IMPLANT
WATER STERILE IRR 1000ML POUR (IV SOLUTION) ×6 IMPLANT

## 2021-03-14 NOTE — Evaluation (Signed)
Physical Therapy Evaluation Patient Details Name: Debra Giles MRN: [id number] DOB: 02-Jun-1981 Today's Date: 03/14/2021   History of Present Illness  Pt s/p L THR and with hx of L hip pinning as a teen  Clinical Impression  Pt s/p L THR and presents with decreased L LE strength/ROM and post op pain limiting functional mobility.  Pt currently mobilizing at min guard/sup level of assist and has been educated on basic mobility tasks, stairs and HEP.  Pt eager for dc home this date.    Follow Up Recommendations Follow surgeon's recommendation for DC plan and follow-up therapies    Equipment Recommendations  None recommended by PT    Recommendations for Other Services       Precautions / Restrictions Precautions Precautions: Fall Restrictions Weight Bearing Restrictions: No Other Position/Activity Restrictions: WBAT      Mobility  Bed Mobility Overal bed mobility: Needs Assistance Bed Mobility: Supine to Sit     Supine to sit: Min guard     General bed mobility comments: increased time, use of rail, cues for sequence and use of R LE to self assist    Transfers Overall transfer level: Needs assistance Equipment used: Rolling walker (2 wheeled) Transfers: Sit to/from Stand Sit to Stand: Min guard;Supervision         General transfer comment: cues for LE management and use of UEs to self assist  Ambulation/Gait Ambulation/Gait assistance: Min guard;Supervision Gait Distance (Feet): 120 Feet Assistive device: Rolling walker (2 wheeled) Gait Pattern/deviations: Step-to pattern;Decreased step length - right;Decreased step length - left;Shuffle;Trunk flexed Gait velocity: decr   General Gait Details: cues for sequence, posture and position from Rw  Stairs Stairs: Yes Stairs assistance: Min assist Stair Management: No rails;One rail Left;Step to pattern;Sideways;Forwards;Backwards;With cane Number of Stairs: 6 General stair comments: single step bkwd, 2 step  up/down with single rail; 3 step with cane and rail  Wheelchair Mobility    Modified Rankin (Stroke Patients Only)       Balance Overall balance assessment: Mild deficits observed, not formally tested                                           Pertinent Vitals/Pain Pain Assessment: 0-10 Pain Score: 2  Pain Location: L hip Pain Descriptors / Indicators: Aching;Sore Pain Intervention(s): Monitored during session;Limited activity within patient's tolerance;Premedicated before session;Ice applied    Home Living Family/patient expects to be discharged to:: Private residence Living Arrangements: Spouse/significant other Available Help at Discharge: Family Type of Home: House Home Access: Stairs to enter Entrance Stairs-Rails: None Entrance Stairs-Number of Steps: 1 Home Layout: Two level Home Equipment: Environmental consultant - 2 wheels;Bedside commode      Prior Function Level of Independence: Independent               Hand Dominance   Dominant Hand: Right    Extremity/Trunk Assessment   Upper Extremity Assessment Upper Extremity Assessment: Overall WFL for tasks assessed    Lower Extremity Assessment Lower Extremity Assessment: LLE deficits/detail LLE Deficits / Details: Strength at hip 2/5 with AAROM at hip to 75 flex and 15 abd    Cervical / Trunk Assessment Cervical / Trunk Assessment: Normal  Communication   Communication: No difficulties  Cognition Arousal/Alertness: Awake/alert Behavior During Therapy: WFL for tasks assessed/performed Overall Cognitive Status: Within Functional Limits for tasks assessed  General Comments      Exercises Total Joint Exercises Ankle Circles/Pumps: AROM;Both;15 reps;Supine Quad Sets: AROM;Both;10 reps;Supine Heel Slides: AAROM;Left;15 reps;Supine Hip ABduction/ADduction: AAROM;Left;10 reps;Supine Long Arc Quad: AAROM;Left;10 reps;Seated   Assessment/Plan     PT Assessment Patient needs continued PT services  PT Problem List Decreased strength;Decreased range of motion;Decreased activity tolerance;Decreased balance;Decreased mobility;Decreased knowledge of use of DME;Pain       PT Treatment Interventions DME instruction;Gait training;Stair training;Functional mobility training;Therapeutic activities;Therapeutic exercise;Patient/family education    PT Goals (Current goals can be found in the Care Plan section)  Acute Rehab PT Goals Patient Stated Goal: Sleep in my own bed tonight PT Goal Formulation: All assessment and education complete, DC therapy    Frequency Min 1X/week   Barriers to discharge        Co-evaluation               AM-PAC PT "6 Clicks" Mobility  Outcome Measure Help needed turning from your back to your side while in a flat bed without using bedrails?: A Little Help needed moving from lying on your back to sitting on the side of a flat bed without using bedrails?: A Little Help needed moving to and from a bed to a chair (including a wheelchair)?: A Little Help needed standing up from a chair using your arms (e.g., wheelchair or bedside chair)?: A Little Help needed to walk in hospital room?: A Little Help needed climbing 3-5 steps with a railing? : A Little 6 Click Score: 18    End of Session Equipment Utilized During Treatment: Gait belt Activity Tolerance: Patient tolerated treatment well Patient left: in chair;with call bell/phone within reach Nurse Communication: Mobility status PT Visit Diagnosis: Difficulty in walking, not elsewhere classified (R26.2)    Time: 1355-1445 PT Time Calculation (min) (ACUTE ONLY): 50 min   Charges:   PT Evaluation $PT Eval Low Complexity: 1 Low PT Treatments $Gait Training: 8-22 mins $Therapeutic Exercise: 8-22 mins        Mauro Kaufmann PT Acute Rehabilitation Services Pager [phone] Office [phone]   Debra Giles 03/14/2021, 3:02 PM

## 2021-03-14 NOTE — Anesthesia Procedure Notes (Signed)
Date/Time: 03/14/2021 7:43 AM Performed by: Jhonnie Garner, CRNA Oxygen Delivery Method: Simple face mask

## 2021-03-14 NOTE — Transfer of Care (Signed)
Immediate Anesthesia Transfer of Care Note  Patient: Debra Giles  Procedure(s) Performed: TOTAL HIP ARTHROPLASTY ANTERIOR APPROACH w/ hardware removal (Left: Hip)  Patient Location: PACU  Anesthesia Type:Spinal  Level of Consciousness: sedated  Airway & Oxygen Therapy: Patient Spontanous Breathing and Patient connected to face mask oxygen  Post-op Assessment: Report given to RN and Post -op Vital signs reviewed and stable  Post vital signs: Reviewed and stable  Last Vitals:  Vitals Value Taken Time  BP 114/58 03/14/21 1100  Temp    Pulse 77 03/14/21 1102  Resp 15 03/14/21 1102  SpO2 100 % 03/14/21 1102  Vitals shown include unvalidated device data.  Last Pain:  Vitals:   03/14/21 0602  TempSrc: Oral  PainSc:       Patients Stated Pain Goal: 3 (03/14/21 0541)  Complications: No notable events documented.

## 2021-03-14 NOTE — Anesthesia Postprocedure Evaluation (Signed)
Anesthesia Post Note  Patient: Debra Giles  Procedure(s) Performed: TOTAL HIP ARTHROPLASTY ANTERIOR APPROACH w/ hardware removal (Left: Hip)     Patient location during evaluation: PACU Anesthesia Type: MAC and Spinal Level of consciousness: awake and alert Pain management: pain level controlled Vital Signs Assessment: post-procedure vital signs reviewed and stable Respiratory status: spontaneous breathing, nonlabored ventilation, respiratory function stable and patient connected to nasal cannula oxygen Cardiovascular status: blood pressure returned to baseline and stable Postop Assessment: no apparent nausea or vomiting Anesthetic complications: no   No notable events documented.  Last Vitals:  Vitals:   03/14/21 1300 03/14/21 1502  BP: 123/75 128/68  Pulse: 90 86  Resp: 16 16  Temp: 36.6 C 36.7 C  SpO2: 96% 96%    Last Pain:  Vitals:   03/14/21 1502  TempSrc:   PainSc: 0-No pain                 Belenda Cruise P Sonakshi Rolland

## 2021-03-14 NOTE — Anesthesia Procedure Notes (Signed)
Spinal  Patient location during procedure: OR Start time: 03/14/2021 7:47 AM End time: 03/14/2021 7:49 AM Staffing Performed: anesthesiologist  Anesthesiologist: Atilano Median, DO Preanesthetic Checklist Completed: patient identified, IV checked, site marked, risks and benefits discussed, surgical consent, monitors and equipment checked, pre-op evaluation and timeout performed Spinal Block Patient position: sitting Prep: DuraPrep Patient monitoring: heart rate, cardiac monitor, continuous pulse ox and blood pressure Approach: midline Location: L3-4 Injection technique: single-shot Needle Needle type: Pencan  Needle gauge: 24 G Needle length: 10 cm Assessment Events: CSF return Additional Notes Patient identified. Risks/Benefits/Options discussed with patient including but not limited to bleeding, infection, nerve damage, paralysis, failed block, incomplete pain control, headache, blood pressure changes, nausea, vomiting, reactions to medications, itching and postpartum back pain. Confirmed with bedside nurse the patient's most recent platelet count. Confirmed with patient that they are not currently taking any anticoagulation, have any bleeding history or any family history of bleeding disorders. Patient expressed understanding and wished to proceed. All questions were answered. Sterile technique was used throughout the entire procedure. Please see nursing notes for vital signs. Warning signs of high block given to the patient including shortness of breath, tingling/numbness in hands, complete motor block, or any concerning symptoms with instructions to call for help. Patient was given instructions on fall risk and not to get out of bed. All questions and concerns addressed with instructions to call with any issues or inadequate analgesia.

## 2021-03-14 NOTE — Op Note (Signed)
OPERATIVE REPORT  SURGEON: Rod Can, MD   ASSISTANT: Nehemiah Massed, PA-C.  PREOPERATIVE DIAGNOSIS: Posttraumatic Left hip arthritis.   POSTOPERATIVE DIAGNOSIS: Posttraumatic Left hip arthritis.   PROCEDURE: Left total hip arthroplasty, anterior approach.   IMPLANTS: DePuy Tri Lock stem, size 3, hi offset. DePuy Pinnacle Cup, size 52 mm. DePuy Altrx liner, size 32 by 52 mm, +4 neutral. DePuy Biolox ceramic head ball, size 32 + 5 mm.  EXPLANTS: 6.5 mm cannulated screws x2.  ANESTHESIA:  MAC and Spinal  ESTIMATED BLOOD LOSS:-500 mL    ANTIBIOTICS: 2 g Ancef.  DRAINS: None.  COMPLICATIONS: None.   CONDITION: PACU - hemodynamically stable.   BRIEF CLINICAL NOTE: Debra Giles is a 40 y.o. 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 After failing conservative management, the patient was indicated for total hip arthroplasty. The risks, benefits, and alternatives to the procedure were explained, and the patient elected to proceed.  PROCEDURE IN DETAIL: Surgical site was marked by myself in the pre-op holding area. Once inside the operating room, spinal anesthesia was obtained, and a foley catheter was inserted. The patient was then positioned on the Hana table.  All bony prominences were well padded.  The hip was prepped and draped in the normal sterile surgical fashion.  A time-out was called verifying side and site of surgery. The patient received IV antibiotics within 60 minutes of beginning the procedure.   Bikini incision was created three finger breadths distal to the ASIS, taking care to stay lateral to the medial border of the ASIS. The direct anterior approach to the hip was performed through the Hueter interval.  Lateral femoral circumflex vessels were treated with the Auqumantys. The anterior capsule was exposed and an inverted T capsulotomy was made.   Using a #10 blade,  the previous incision was excised. Blunt digital dissection was performed until the IT band was reached. The IT band was split longitudinally. I was able to palpate the screw heads. Scar tissue was removed with the Bovie, and I removed the screws sequentially with a screwdriver.   Attention was returned to the hip. The femoral neck cut was made to the level of the templated cut.  A corkscrew was placed into the head and the head was removed.  The femoral head was found to have eburnated bone and had a large collar of osteophytes. The head was passed to the back table and was measured. Pubofemoral ligament was released off of the calcar to the lesser trochanter, taking care to stay on bone. Superior capsule was released from the greater trochanter, taking care to stay lateral to the posterior border of the femoral neck in order to preserve the short external rotators.   Acetabular exposure was achieved, and the pulvinar and labrum were excised. Sequential reaming of the acetabulum was then performed up to a size 51 mm reamer under direct visulization. A 52 mm cup was then opened and impacted into place at approximately 40 degrees of abduction and 20 degrees of anteversion. The final polyethylene liner was impacted into place and acetabular osteophytes were removed.    I then gained femoral exposure taking care to protect the abductors and greater trochanter.  This was performed using standard external rotation, extension, and adduction.  A cookie cutter was used to enter the femoral canal, and then the femoral canal finder was placed.  Sequential broaching was performed up to  a size 3.  Femoral bone was very sclerotic. Calcar planer was used on the femoral neck remnant.  I placed a hi offset neck and a trial head ball.  The hip was reduced.  Leg lengths and offset were checked fluoroscopically.  The hip was dislocated and trial components were removed.  The final implants were placed, and the hip was reduced.   Fluoroscopy was used to confirm component position and leg lengths.  At 90 degrees of external rotation and full extension, the hip was stable to an anterior directed force.   The wounds were copiously irrigated with Irrisept solution and normal saline using pule lavage.  Marcaine solution was injected into the periarticular soft tissue.  The wounds were closed in layers using #1 Vicryl and Stratafix for the fascia, 2-0 Vicryl for the subcutaneous fat, 2-0 Monocryl for the deep dermal layer, 3-0 running Monocryl subcuticular stitch, and Dermabond for the skin.  Once the glue was fully dried, an Aquacell Ag dressing was applied.  The patient was transported to the recovery room in stable condition.  Sponge, needle, and instrument counts were correct at the end of the case x2.  The patient tolerated the procedure well and there were no known complications.  Please note that a surgical assistant was a medical necessity for this procedure to perform it in a safe and expeditious manner. Assistant was necessary to provide appropriate retraction of vital neurovascular structures, to prevent femoral fracture, and to allow for anatomic placement of the prosthesis.

## 2021-03-14 NOTE — Discharge Instructions (Signed)
? ?Dr. Camerin Ladouceur ?Joint Replacement Specialist ?[facility] ?[address] ?, [place] [address] ?[phone] ? ? ?TOTAL HIP REPLACEMENT POSTOPERATIVE DIRECTIONS ? ? ? ?Hip Rehabilitation, Guidelines Following Surgery  ? ?WEIGHT BEARING ?Weight bearing as tolerated with assist device (walker, cane, etc) as directed, use it as long as suggested by your surgeon or therapist, typically at least 4-6 weeks. ? ?The results of a hip operation are greatly improved after range of motion and muscle strengthening exercises. Follow all safety measures which are given to protect your hip. If any of these exercises cause increased pain or swelling in your joint, decrease the amount until you are comfortable again. Then slowly increase the exercises. Call your caregiver if you have problems or questions.  ? ?HOME CARE INSTRUCTIONS  ?Most of the following instructions are designed to prevent the dislocation of your new hip.  ?Remove items at home which could result in a fall. This includes throw rugs or furniture in walking pathways.  ?Continue medications as instructed at time of discharge. ?You may have some home medications which will be placed on hold until you complete the course of blood thinner medication. ?You may start showering once you are discharged home. Do not remove your dressing. ?Do not put on socks or shoes without following the instructions of your caregivers.   ?Sit on chairs with arms. Use the chair arms to help push yourself up when arising.  ?Arrange for the use of a toilet seat elevator so you are not sitting low.  ?Walk with walker as instructed.  ?You may resume a sexual relationship in one month or when given the OK by your caregiver.  ?Use walker as long as suggested by your caregivers.  ?You may put full weight on your legs and walk as much as is comfortable. ?Avoid periods of inactivity such as sitting longer than an hour when not asleep. This helps prevent blood  clots.  ?You may return to work once you are cleared by your surgeon.  ?Do not drive a car for 6 weeks or until released by your surgeon.  ?Do not drive while taking narcotics.  ?Wear elastic stockings for two weeks following surgery during the day but you may remove then at night.  ?Make sure you keep all of your appointments after your operation with all of your doctors and caregivers. You should call the office at the above phone number and make an appointment for approximately two weeks after the date of your surgery. ?Please pick up a stool softener and laxative for home use as long as you are requiring pain medications. ?ICE to the affected hip every three hours for 30 minutes at a time and then as needed for pain and swelling. Continue to use ice on the hip for pain and swelling from surgery. You may notice swelling that will progress down to the foot and ankle.  This is normal after surgery.  Elevate the leg when you are not up walking on it.   ?It is important for you to complete the blood thinner medication as prescribed by your doctor. ?Continue to use the breathing machine which will help keep your temperature down.  It is common for your temperature to cycle up and down following surgery, especially at night when you are not up moving around and exerting yourself.  The breathing machine keeps your lungs expanded and your temperature down. ? ?RANGE OF MOTION AND STRENGTHENING EXERCISES  ?These exercises are designed to help you   keep full movement of your hip joint. Follow your caregiver's or physical therapist's instructions. Perform all exercises about fifteen times, three times per day or as directed. Exercise both hips, even if you have had only one joint replacement. These exercises can be done on a training (exercise) mat, on the floor, on a table or on a bed. Use whatever works the best and is most comfortable for you. Use music or television while you are exercising so that the exercises are a  pleasant break in your day. This will make your life better with the exercises acting as a break in routine you can look forward to.  ?Lying on your back, slowly slide your foot toward your buttocks, raising your knee up off the floor. Then slowly slide your foot back down until your leg is straight again.  ?Lying on your back spread your legs as far apart as you can without causing discomfort.  ?Lying on your side, raise your upper leg and foot straight up from the floor as far as is comfortable. Slowly lower the leg and repeat.  ?Lying on your back, tighten up the muscle in the front of your thigh (quadriceps muscles). You can do this by keeping your leg straight and trying to raise your heel off the floor. This helps strengthen the largest muscle supporting your knee.  ?Lying on your back, tighten up the muscles of your buttocks both with the legs straight and with the knee bent at a comfortable angle while keeping your heel on the floor.  ? ?SKILLED REHAB INSTRUCTIONS: ?If the patient is transferred to a skilled rehab facility following release from the hospital, a list of the current medications will be sent to the facility for the patient to continue.  When discharged from the skilled rehab facility, please have the facility set up the patient's Home Health Physical Therapy prior to being released. Also, the skilled facility will be responsible for providing the patient with their medications at time of release from the facility to include their pain medication and their blood thinner medication. If the patient is still at the rehab facility at time of the two week follow up appointment, the skilled rehab facility will also need to assist the patient in arranging follow up appointment in our office and any transportation needs. ? ?POST-OPERATIVE OPIOID TAPER INSTRUCTIONS: ?It is important to wean off of your opioid medication as soon as possible. If you do not need pain medication after your surgery it is ok  to stop day one. ?Opioids include: ?Codeine, Hydrocodone(Norco, Vicodin), Oxycodone(Percocet, oxycontin) and hydromorphone amongst others.  ?Long term and even short term use of opiods can cause: ?Increased pain response ?Dependence ?Constipation ?Depression ?Respiratory depression ?And more.  ?Withdrawal symptoms can include ?Flu like symptoms ?Nausea, vomiting ?And more ?Techniques to manage these symptoms ?Hydrate well ?Eat regular healthy meals ?Stay active ?Use relaxation techniques(deep breathing, meditating, yoga) ?Do Not substitute Alcohol to help with tapering ?If you have been on opioids for less than two weeks and do not have pain than it is ok to stop all together.  ?Plan to wean off of opioids ?This plan should start within one week post op of your joint replacement. ?Maintain the same interval or time between taking each dose and first decrease the dose.  ?Cut the total daily intake of opioids by one tablet each day ?Next start to increase the time between doses. ?The last dose that should be eliminated is the evening dose.  ? ? ?MAKE   SURE YOU:  ?Understand these instructions.  ?Will watch your condition.  ?Will get help right away if you are not doing well or get worse. ? ?Pick up stool softner and laxative for home use following surgery while on pain medications. ?Do not remove your dressing. ?The dressing is waterproof--it is OK to take showers. ?Continue to use ice for pain and swelling after surgery. ?Do not use any lotions or creams on the incision until instructed by your surgeon. ?Total Hip Protocol. ? ?

## 2021-03-14 NOTE — Interval H&P Note (Signed)
History and Physical Interval Note:  03/14/2021 7:34 AM  Debra Giles  has presented today for surgery, with the diagnosis of Left hip degenerative joint disease, retained hardware.  The various methods of treatment have been discussed with the patient and family. After consideration of risks, benefits and other options for treatment, the patient has consented to  Procedure(s): TOTAL HIP ARTHROPLASTY ANTERIOR APPROACH w/ hardware removal (Left) as a surgical intervention.  The patient's history has been reviewed, patient examined, no change in status, stable for surgery.  I have reviewed the patient's chart and labs.  Questions were answered to the patient's satisfaction.    The risks, benefits, and alternatives were discussed with the patient. There are risks associated with the surgery including, but not limited to, problems with anesthesia (death), infection, instability (giving out of the joint), dislocation, differences in leg length/angulation/rotation, fracture of bones, loosening or failure of implants, hematoma (blood accumulation) which may require surgical drainage, blood clots, pulmonary embolism, nerve injury (foot drop and lateral thigh numbness), and blood vessel injury. The patient understands these risks and elects to proceed.    [name]

## 2021-03-15 ENCOUNTER — Encounter (HOSPITAL_COMMUNITY): Payer: Self-pay | Admitting: Orthopedic Surgery

## 2021-03-18 ENCOUNTER — Encounter: Admitting: Family Medicine

## 2021-04-24 ENCOUNTER — Ambulatory Visit: Admitting: Registered Nurse

## 2021-06-24 ENCOUNTER — Encounter: Admitting: Family Medicine

## 2021-08-06 ENCOUNTER — Ambulatory Visit (INDEPENDENT_AMBULATORY_CARE_PROVIDER_SITE_OTHER): Admitting: Family Medicine

## 2021-08-06 ENCOUNTER — Other Ambulatory Visit: Payer: Self-pay

## 2021-08-06 ENCOUNTER — Encounter: Payer: Self-pay | Admitting: Family Medicine

## 2021-08-06 VITALS — BP 112/80 | HR 92 | Temp 97.8°F | Ht 65.0 in | Wt 238.5 lb

## 2021-08-06 DIAGNOSIS — D500 Iron deficiency anemia secondary to blood loss (chronic): Secondary | ICD-10-CM

## 2021-08-06 DIAGNOSIS — Z6839 Body mass index (BMI) 39.0-39.9, adult: Secondary | ICD-10-CM

## 2021-08-06 DIAGNOSIS — Z0000 Encounter for general adult medical examination without abnormal findings: Secondary | ICD-10-CM

## 2021-08-06 DIAGNOSIS — I10 Essential (primary) hypertension: Secondary | ICD-10-CM

## 2021-08-06 DIAGNOSIS — Z96642 Presence of left artificial hip joint: Secondary | ICD-10-CM | POA: Diagnosis not present

## 2021-08-06 LAB — LIPID PANEL
Cholesterol: 194 mg/dL (ref 0–200)
HDL: 42.5 mg/dL (ref >39.00)
LDL Cholesterol: 131 mg/dL — ABNORMAL HIGH (ref 0–99)
NonHDL: 151.64
Total CHOL/HDL Ratio: 5
Triglycerides: 103 mg/dL (ref 0.0–149.0)
VLDL: 20.6 mg/dL (ref 0.0–40.0)

## 2021-08-06 LAB — B12 AND FOLATE PANEL
Folate: 9.6 ng/mL (ref >5.9)
Vitamin B-12: 192 pg/mL — ABNORMAL LOW (ref 211–911)

## 2021-08-06 LAB — CBC WITH DIFFERENTIAL/PLATELET
Basophils Absolute: 0 10*3/uL (ref 0.0–0.1)
Basophils Relative: 0.4 % (ref 0.0–3.0)
Eosinophils Absolute: 0.2 10*3/uL (ref 0.0–0.7)
Eosinophils Relative: 2.9 % (ref 0.0–5.0)
HCT: 29 % — ABNORMAL LOW (ref 36.0–46.0)
Hemoglobin: 9 g/dL — ABNORMAL LOW (ref 12.0–15.0)
Lymphocytes Relative: 28.9 % (ref 12.0–46.0)
Lymphs Abs: 1.9 10*3/uL (ref 0.7–4.0)
MCHC: 31.1 g/dL (ref 30.0–36.0)
MCV: 65.5 fl — ABNORMAL LOW (ref 78.0–100.0)
Monocytes Absolute: 0.4 10*3/uL (ref 0.1–1.0)
Monocytes Relative: 5.4 % (ref 3.0–12.0)
Neutro Abs: 4.1 10*3/uL (ref 1.4–7.7)
Neutrophils Relative %: 62.4 % (ref 43.0–77.0)
Platelets: 327 10*3/uL (ref 150.0–400.0)
RBC: 4.43 Mil/uL (ref 3.87–5.11)
RDW: 18.3 % — ABNORMAL HIGH (ref 11.5–15.5)
WBC: 6.6 10*3/uL (ref 4.0–10.5)

## 2021-08-06 LAB — COMPREHENSIVE METABOLIC PANEL
ALT: 13 U/L (ref 0–35)
AST: 14 U/L (ref 0–37)
Albumin: 4 g/dL (ref 3.5–5.2)
Alkaline Phosphatase: 51 U/L (ref 39–117)
BUN: 12 mg/dL (ref 6–23)
CO2: 30 mEq/L (ref 19–32)
Calcium: 8.9 mg/dL (ref 8.4–10.5)
Chloride: 101 mEq/L (ref 96–112)
Creatinine, Ser: 0.67 mg/dL (ref 0.40–1.20)
GFR: 111.24 mL/min (ref >60.00)
Glucose, Bld: 103 mg/dL — ABNORMAL HIGH (ref 70–99)
Potassium: 4.1 mEq/L (ref 3.5–5.1)
Sodium: 138 mEq/L (ref 135–145)
Total Bilirubin: 0.7 mg/dL (ref 0.2–1.2)
Total Protein: 6.6 g/dL (ref 6.0–8.3)

## 2021-08-06 LAB — TSH: TSH: 1.98 u[IU]/mL (ref 0.35–5.50)

## 2021-08-06 MED ORDER — TIRZEPATIDE 2.5 MG/0.5ML ~~LOC~~ SOAJ
2.5000 mg | SUBCUTANEOUS | 0 refills | Status: DC
  Filled 2021-08-07: qty 2, 28d supply, fill #0

## 2021-08-06 NOTE — Patient Instructions (Signed)
Please return in 6 months for hypertension follow up.   I will release your lab results to you on your MyChart account with further instructions. Please reply with any questions.    Check your blood pressure at home and send me readings in a few weeks please. We want it 120s/70s consistently or less.  Take the RX for [facility] to pharmacy; and look at the website to print coupon and see how it can get covered.  If you have any questions or concerns, please don't hesitate to send me a message via MyChart or call the office at [phone]. Thank you for visiting with [place] today! It's our pleasure caring for you.   Please do these things to maintain good health!  Exercise at least 30-45 minutes a day,  4-5 days a week.  Eat a low-fat diet with lots of fruits and vegetables, up to 7-9 servings per day. Drink plenty of water daily. Try to drink 8 8oz glasses per day. Seatbelts can save your life. Always wear your seatbelt. Place Smoke Detectors on every level of your home and check batteries every year. Schedule an appointment with an eye doctor for an eye exam every 1-2 years Safe sex - use condoms to protect yourself from STDs if you could be exposed to these types of infections. Use birth control if you do not want to become pregnant and are sexually active. Avoid heavy alcohol use. If you drink, keep it to less than 2 drinks/day and not every day. Health Care Power of Attorney.  Choose someone you trust that could speak for you if you became unable to speak for yourself. Depression is common in our stressful world.If you're feeling down or losing interest in things you normally enjoy, please come in for a visit. If anyone is threatening or hurting you, please get help. Physical or Emotional Violence is never OK.

## 2021-08-06 NOTE — Progress Notes (Signed)
Subjective  Chief Complaint  Patient presents with   Annual Exam   ADHD    HPI: Debra Giles is a 40 y.o. female who presents to [facility] [place] today for a Female Wellness Visit.  She also has the concerns and/or needs as listed above in the chief complaint. These will be addressed in addition to the Health Maintenance Visit.   Wellness Visit: annual visit with health maintenance review and exam without Pap  HM: up to date. Declines flu vaccine. IUD for contraception. amenorrheic Health Maintenance  Topic Date Due   COVID-19 Vaccine (1) Never done   Hepatitis C Screening  Never done   PAP SMEAR-Modifier  10/06/2022   TETANUS/TDAP  03/25/2027   HIV Screening  Completed   Pneumococcal Vaccine 63-37 Years old  Aged Out   HPV VACCINES  Aged Out   INFLUENZA VACCINE  Discontinued    Chronic disease management visit and/or acute problem visit: Obesity: considering weight loss surgery. Hasn't used medications. Diet is slightly improved.  HTN: improved on amlodipine 2.5 and ace. Feeling well. Taking medications w/o adverse effects. No symptoms of CHF, angina; no palpitations, sob, cp or lower extremity edema. Compliant with meds.  Had left hip replacement in June; "life changing".  Anemia: possible nutritional. No blood loss. Feels well.   Assessment  1. Annual physical exam   2. Essential hypertension   3. History of total hip arthroplasty, left   4. Iron deficiency anemia due to chronic blood loss   5. Class 2 severe obesity due to excess calories with serious comorbidity and body mass index (BMI) of 39.0 to 39.9 in adult [facility])      Plan  Female Wellness Visit: Age appropriate Health Maintenance and Prevention measures were discussed with patient. Included topics are cancer screening recommendations, ways to keep healthy (see AVS) including dietary and exercise recommendations, regular eye and dental care, use of seat belts, and avoidance of moderate  alcohol use and tobacco use.  BMI: discussed patient's BMI and encouraged positive lifestyle modifications to help get to or maintain a target BMI. HM needs and immunizations were addressed and ordered. See below for orders. See HM and immunization section for updates. Routine labs and screening tests ordered including cmp, cbc and lipids where appropriate. Discussed recommendations regarding Vit D and calcium supplementation (see AVS)  Chronic disease f/u and/or acute problem visit: (deemed necessary to be done in addition to the wellness visit): HTN:   well controlled. Check renal function and electrolytes. Able to increase exercise now that is no longer in pain.  Work up anemia. Iron replacement and monitor Discussed obesity treatment options. Start mounjaro. Education given.    Follow up: 3 mo for f/u bp and weight   Orders Placed This Encounter  Procedures   CBC with Differential/Platelet   Comprehensive metabolic panel   Hepatitis C antibody   Lipid panel   Iron, TIBC and Ferritin Panel   TSH   B12 and Folate Panel   Meds ordered this encounter  Medications   tirzepatide (MOUNJARO) 2.5 MG/0.5ML Pen    Sig: Inject 2.5 mg into the skin once a week for 28 days.    Dispense:  2 mL    Refill:  0       Body mass index is 39.69 kg/m. Wt Readings from Last 3 Encounters:  08/06/21 238 lb 8 oz (108.2 kg)  03/14/21 229 lb (103.9 kg)  03/06/21 229 lb (103.9 kg)  Patient Active Problem List   Diagnosis Date Noted   History of total hip arthroplasty, left 08/06/2021    2022    Chronic left shoulder pain 03/14/2020   History of gestational diabetes 03/14/2020   Iron deficiency anemia due to chronic blood loss 09/25/2017   Essential hypertension 06/06/2016    Formatting of this note might be different from the original. Dxd 2015; well controlled. Strong FH on HTN    Gastroesophageal reflux disease without esophagitis 06/06/2016   Class 2 severe obesity due to excess  calories with serious comorbidity and body mass index (BMI) of 35.0 to 35.9 in adult [facility](HCC) 06/06/2016    Formatting of this note might be different from the original. IMO 2019 R1.0 Update    Recurrent major depressive disorder, in full remission (HCC) 06/06/2016   IUD contraception 08/23/2012   Health Maintenance  Topic Date Due   COVID-19 Vaccine (1) Never done   Hepatitis C Screening  Never done   PAP SMEAR-Modifier  10/06/2022   TETANUS/TDAP  03/25/2027   HIV Screening  Completed   Pneumococcal Vaccine 819-38 Years old  Aged Out   HPV VACCINES  Aged Out   INFLUENZA VACCINE  Discontinued   Immunization History  Administered Date(s) Administered   Td 07/31/2014   Tdap 10/07/2009, 03/24/2017   We updated and reviewed the patient's past history in detail and it is documented below. Allergies: Patient  reports no history of alcohol use. Past Medical History Patient  has a past medical history of Anemia, Anxiety, Arthritis, Complication of anesthesia, Depression, GERD (gastroesophageal reflux disease), Gestational diabetes, History of HELLP syndrome, currently pregnant, Hypertension, Pneumonia, PONV (postoperative nausea and vomiting), and Urticaria. Past Surgical History Patient  has a past surgical history that includes Hip surgery (Left, 1997); Cesarean section (02/20/2008); Cesarean section with bilateral tubal ligation (N/A, 01/16/2020); and Total hip arthroplasty (Left, 03/14/2021). Social History   Socioeconomic History   Marital status: Married    Spouse name: Not on file   Number of children: Not on file   Years of education: Not on file   Highest education level: Not on file  Occupational History   Not on file  Tobacco Use   Smoking status: Former   Smokeless tobacco: Never  Vaping Use   Vaping Use: Never used  Substance and Sexual Activity   Alcohol use: No   Drug use: No   Sexual activity: Yes  Other Topics Concern   Not on file  Social History Narrative   Not  on file   Social Determinants of Health   Financial Resource Strain: Not on file  Food Insecurity: Not on file  Transportation Needs: Not on file  Physical Activity: Not on file  Stress: Not on file  Social Connections: Not on file   Family History  Problem Relation Age of Onset   Frontotemporal dementia Mother        Primary Progressive Aphasia    ADD / ADHD Brother    Lupus Brother     Review of Systems: Constitutional: negative for fever or malaise Ophthalmic: negative for photophobia, double vision or loss of vision Cardiovascular: negative for chest pain, dyspnea on exertion, or new LE swelling Respiratory: negative for SOB or persistent cough Gastrointestinal: negative for abdominal pain, change in bowel habits or melena Genitourinary: negative for dysuria or gross hematuria, no abnormal uterine bleeding or disharge Musculoskeletal: negative for new gait disturbance or muscular weakness Integumentary: negative for new or persistent rashes, no breast lumps Neurological:  negative for TIA or stroke symptoms Psychiatric: negative for SI or delusions Allergic/Immunologic: negative for hives  Patient Care Team    Relationship Specialty Notifications Start End  Willow Ora, MD PCP - General Family Medicine  07/26/20     Objective  Vitals: BP 112/80 (BP Location: Left Arm, Patient Position: Sitting, Cuff Size: Large)   Pulse 92   Temp 97.8 F (36.6 C) (Temporal)   Ht 5\' 5"  (1.651 m)   Wt 238 lb 8 oz (108.2 kg)   SpO2 96%   Breastfeeding No   BMI 39.69 kg/m  General:  Well developed, well nourished, no acute distress  Psych:  Alert and orientedx3,normal mood and affect HEENT:  Normocephalic, atraumatic, non-icteric sclera, PERRL, supple neck without adenopathy, mass or thyromegaly Cardiovascular:  Normal S1, S2, RRR without gallop, rub or murmur Respiratory:  Good breath sounds bilaterally, CTAB with normal respiratory effort Gastrointestinal: normal bowel  sounds, soft, non-tender, no noted masses. No HSM MSK: no deformities, contusions. Joints are without erythema or swelling.  Skin:  Warm, no rashes or suspicious lesions noted Neurologic:    Mental status is normal. Gross motor and sensory exams are normal. Normal gait. No tremor     Commons side effects, risks, benefits, and alternatives for medications and treatment plan prescribed today were discussed, and the patient expressed understanding of the given instructions. Patient is instructed to call or message via MyChart if he/she has any questions or concerns regarding our treatment plan. No barriers to understanding were identified. We discussed Red Flag symptoms and signs in detail. Patient expressed understanding regarding what to do in case of urgent or emergency type symptoms.  Medication list was reconciled, printed and provided to the patient in AVS. Patient instructions and summary information was reviewed with the patient as documented in the AVS. This note was prepared with assistance of Dragon voice recognition software. Occasional wrong-word or sound-a-like substitutions may have occurred due to the inherent limitations of voice recognition software  This visit occurred during the SARS-CoV-2 public health emergency.  Safety protocols were in place, including screening questions prior to the visit, additional usage of staff PPE, and extensive cleaning of exam room while observing appropriate contact time as indicated for disinfecting solutions.

## 2021-08-07 ENCOUNTER — Other Ambulatory Visit (HOSPITAL_BASED_OUTPATIENT_CLINIC_OR_DEPARTMENT_OTHER): Payer: Self-pay

## 2021-08-09 LAB — HEPATITIS C ANTIBODY
Hepatitis C Ab: NONREACTIVE
SIGNAL TO CUT-OFF: 0.07 (ref <1.00)

## 2021-08-09 LAB — IRON,TIBC AND FERRITIN PANEL
%SAT: 7 % (calc) — ABNORMAL LOW (ref 16–45)
Ferritin: 3 ng/mL — ABNORMAL LOW (ref 16–154)
Iron: 27 ug/dL — ABNORMAL LOW (ref 40–190)
TIBC: 398 mcg/dL (calc) (ref 250–450)

## 2021-08-20 ENCOUNTER — Telehealth: Payer: Self-pay

## 2021-08-20 NOTE — Telephone Encounter (Signed)
Pt was returning a call. Please Advise.  

## 2021-08-20 NOTE — Progress Notes (Signed)
Please call patient: I have reviewed his/her lab results. Please apologize for late response: anemia remains and she is very iron deficient. Partly from surgery. Need to start iron: I recommend iron 2-3x/day and let's recheck in office in 6-8 weeks. Also with low b12 which could be playing a role: start otc b12 1000mg  daily as well. Nurse visit in 8 weeks: iron studies, cbc and b12 levels please.

## 2021-08-21 ENCOUNTER — Other Ambulatory Visit: Payer: Self-pay

## 2021-08-21 DIAGNOSIS — D500 Iron deficiency anemia secondary to blood loss (chronic): Secondary | ICD-10-CM

## 2021-08-21 DIAGNOSIS — E538 Deficiency of other specified B group vitamins: Secondary | ICD-10-CM

## 2021-08-21 NOTE — Telephone Encounter (Signed)
Spoke with patient.

## 2021-08-29 ENCOUNTER — Other Ambulatory Visit: Payer: Self-pay | Admitting: Family Medicine

## 2021-08-29 ENCOUNTER — Encounter: Payer: Self-pay | Admitting: Family Medicine

## 2021-09-02 MED ORDER — TIRZEPATIDE 5 MG/0.5ML ~~LOC~~ SOAJ
5.0000 mg | SUBCUTANEOUS | 0 refills | Status: DC
Start: 2021-09-02 — End: 2021-09-04

## 2021-09-04 ENCOUNTER — Other Ambulatory Visit: Payer: Self-pay | Admitting: Family Medicine

## 2021-09-04 ENCOUNTER — Other Ambulatory Visit (HOSPITAL_BASED_OUTPATIENT_CLINIC_OR_DEPARTMENT_OTHER): Payer: Self-pay

## 2021-09-04 ENCOUNTER — Other Ambulatory Visit: Payer: Self-pay

## 2021-09-04 MED ORDER — TIRZEPATIDE 5 MG/0.5ML ~~LOC~~ SOAJ
5.0000 mg | SUBCUTANEOUS | 0 refills | Status: DC
  Filled 2021-09-04 (×2): qty 2, 28d supply, fill #0

## 2021-09-30 ENCOUNTER — Encounter: Payer: Self-pay | Admitting: Family Medicine

## 2021-10-02 ENCOUNTER — Encounter: Payer: Self-pay | Admitting: Family Medicine

## 2021-10-02 MED ORDER — TIRZEPATIDE 7.5 MG/0.5ML ~~LOC~~ SOAJ
7.5000 mg | SUBCUTANEOUS | 1 refills | Status: DC
Start: 2021-10-02 — End: 2021-10-31
  Filled 2021-10-02: qty 2, 28d supply, fill #0

## 2021-10-02 MED ORDER — CITALOPRAM HYDROBROMIDE 20 MG PO TABS
20.0000 mg | ORAL_TABLET | Freq: Every day | ORAL | 3 refills | Status: DC
  Filled 2021-10-02: qty 90, 90d supply, fill #0
  Filled 2022-01-20: qty 90, 90d supply, fill #1
  Filled 2022-05-13: qty 90, 90d supply, fill #2
  Filled 2022-09-08: qty 90, 90d supply, fill #3

## 2021-10-03 ENCOUNTER — Other Ambulatory Visit (HOSPITAL_BASED_OUTPATIENT_CLINIC_OR_DEPARTMENT_OTHER): Payer: Self-pay

## 2021-10-12 ENCOUNTER — Encounter: Payer: Self-pay | Admitting: Family Medicine

## 2021-10-17 ENCOUNTER — Other Ambulatory Visit (INDEPENDENT_AMBULATORY_CARE_PROVIDER_SITE_OTHER)

## 2021-10-17 ENCOUNTER — Other Ambulatory Visit: Payer: Self-pay

## 2021-10-17 DIAGNOSIS — E538 Deficiency of other specified B group vitamins: Secondary | ICD-10-CM

## 2021-10-17 DIAGNOSIS — D500 Iron deficiency anemia secondary to blood loss (chronic): Secondary | ICD-10-CM | POA: Diagnosis not present

## 2021-10-17 LAB — CBC WITH DIFFERENTIAL/PLATELET
Basophils Absolute: 0 10*3/uL (ref 0.0–0.1)
Basophils Relative: 0.5 % (ref 0.0–3.0)
Eosinophils Absolute: 0.3 10*3/uL (ref 0.0–0.7)
Eosinophils Relative: 4.5 % (ref 0.0–5.0)
HCT: 35.4 % — ABNORMAL LOW (ref 36.0–46.0)
Hemoglobin: 11.2 g/dL — ABNORMAL LOW (ref 12.0–15.0)
Lymphocytes Relative: 21 % (ref 12.0–46.0)
Lymphs Abs: 1.4 10*3/uL (ref 0.7–4.0)
MCHC: 31.7 g/dL (ref 30.0–36.0)
MCV: 72.8 fl — ABNORMAL LOW (ref 78.0–100.0)
Monocytes Absolute: 0.4 10*3/uL (ref 0.1–1.0)
Monocytes Relative: 5.9 % (ref 3.0–12.0)
Neutro Abs: 4.5 10*3/uL (ref 1.4–7.7)
Neutrophils Relative %: 68.1 % (ref 43.0–77.0)
Platelets: 296 10*3/uL (ref 150.0–400.0)
RBC: 4.85 Mil/uL (ref 3.87–5.11)
RDW: 23.5 % — ABNORMAL HIGH (ref 11.5–15.5)
WBC: 6.7 10*3/uL (ref 4.0–10.5)

## 2021-10-17 LAB — IRON,TIBC AND FERRITIN PANEL
%SAT: 12 % (calc) — ABNORMAL LOW (ref 16–45)
Ferritin: 14 ng/mL — ABNORMAL LOW (ref 16–154)
Iron: 35 ug/dL — ABNORMAL LOW (ref 40–190)
TIBC: 301 mcg/dL (calc) (ref 250–450)

## 2021-10-17 LAB — VITAMIN B12: Vitamin B-12: 701 pg/mL (ref 211–911)

## 2021-10-28 ENCOUNTER — Telehealth: Payer: Self-pay

## 2021-10-28 NOTE — Progress Notes (Signed)
LVM for patient to return call. 

## 2021-10-28 NOTE — Progress Notes (Signed)
LVM for patient to call back. ?

## 2021-10-28 NOTE — Telephone Encounter (Signed)
Pt has called back in for lab results.

## 2021-10-28 NOTE — Telephone Encounter (Signed)
FYI Patient has called back for lab results.  Does not want a call back b/c she is getting ready to lay down with her daughter for a nap.  States will call back later.

## 2021-10-30 ENCOUNTER — Encounter: Payer: Self-pay | Admitting: Family Medicine

## 2021-10-30 NOTE — Telephone Encounter (Signed)
Spoke with patient.

## 2021-10-31 ENCOUNTER — Other Ambulatory Visit: Payer: Self-pay | Admitting: Family Medicine

## 2021-10-31 ENCOUNTER — Encounter: Payer: Self-pay | Admitting: Family Medicine

## 2021-10-31 ENCOUNTER — Other Ambulatory Visit (HOSPITAL_BASED_OUTPATIENT_CLINIC_OR_DEPARTMENT_OTHER): Payer: Self-pay

## 2021-11-01 ENCOUNTER — Other Ambulatory Visit: Payer: Self-pay | Admitting: Family Medicine

## 2021-11-01 ENCOUNTER — Other Ambulatory Visit (HOSPITAL_BASED_OUTPATIENT_CLINIC_OR_DEPARTMENT_OTHER): Payer: Self-pay

## 2021-11-01 ENCOUNTER — Encounter (HOSPITAL_BASED_OUTPATIENT_CLINIC_OR_DEPARTMENT_OTHER): Payer: Self-pay

## 2021-11-01 ENCOUNTER — Other Ambulatory Visit: Payer: Self-pay

## 2021-11-01 MED ORDER — MOUNJARO 7.5 MG/0.5ML ~~LOC~~ SOAJ
7.5000 mg | SUBCUTANEOUS | 1 refills | Status: DC
  Filled 2021-11-01: qty 2, 28d supply, fill #0

## 2021-11-01 MED ORDER — TIRZEPATIDE 10 MG/0.5ML ~~LOC~~ SOAJ
10.0000 mg | SUBCUTANEOUS | 3 refills | Status: DC
  Filled 2021-11-01: qty 2, 28d supply, fill #0
  Filled 2021-12-02: qty 2, 28d supply, fill #1
  Filled 2021-12-30: qty 2, 28d supply, fill #2
  Filled 2022-01-20 – 2022-01-28 (×2): qty 2, 28d supply, fill #3
  Filled 2022-02-26: qty 2, 28d supply, fill #4

## 2021-11-01 MED ORDER — MOUNJARO 7.5 MG/0.5ML ~~LOC~~ SOAJ
10.0000 mg | SUBCUTANEOUS | 1 refills | Status: DC
  Filled 2021-11-01: qty 2, 21d supply, fill #0

## 2021-11-04 ENCOUNTER — Telehealth: Payer: Self-pay | Admitting: *Deleted

## 2021-11-04 NOTE — Telephone Encounter (Signed)
Send lab results via [employer]

## 2021-11-04 NOTE — Telephone Encounter (Signed)
Lab results send via MyChart  

## 2021-11-06 ENCOUNTER — Encounter: Payer: Self-pay | Admitting: Family Medicine

## 2021-11-06 ENCOUNTER — Ambulatory Visit (INDEPENDENT_AMBULATORY_CARE_PROVIDER_SITE_OTHER): Admitting: Family Medicine

## 2021-11-06 ENCOUNTER — Other Ambulatory Visit: Payer: Self-pay

## 2021-11-06 VITALS — BP 104/71 | HR 84 | Temp 97.9°F | Ht 65.0 in | Wt 212.2 lb

## 2021-11-06 DIAGNOSIS — I10 Essential (primary) hypertension: Secondary | ICD-10-CM

## 2021-11-06 DIAGNOSIS — E538 Deficiency of other specified B group vitamins: Secondary | ICD-10-CM

## 2021-11-06 DIAGNOSIS — D500 Iron deficiency anemia secondary to blood loss (chronic): Secondary | ICD-10-CM | POA: Diagnosis not present

## 2021-11-06 DIAGNOSIS — F3342 Major depressive disorder, recurrent, in full remission: Secondary | ICD-10-CM

## 2021-11-06 DIAGNOSIS — Z6839 Body mass index (BMI) 39.0-39.9, adult: Secondary | ICD-10-CM

## 2021-11-06 NOTE — Patient Instructions (Signed)
Please follow up as scheduled for your next visit with me: 02/04/2022 we will recheck your blood pressure and weight and iron levels/b12 levels at that time.  Please stop the amlodipine. Please let me know if your blood pressure remains intermittently low and then I will adjust down your lisinopril to 20mg  daily. Give it 2-4 weeks.   Keep taking the mounjaro; I have refilled it.   Well done!!   If you have any questions or concerns, please don't hesitate to send me a message via MyChart or call the office at [phone]. Thank you for visiting with [phone] today! It's our pleasure caring for you.

## 2021-11-06 NOTE — Progress Notes (Signed)
Subjective  CC:  Chief Complaint  Patient presents with   Hypertension   Weight Check    HPI: Debra Giles is a 41 y.o. female who presents to the office today to address the problems listed above in the chief complaint. 41 year old female with obesity on Mounjaro for the last 3 months.  Now tolerating 10 mg weekly.  Has done very well.  Down 26 pounds.  No major adverse side effects.  Mild nausea on day 1.  Is changing the way she eats.  Making healthier choices, meal planning.  Starting to exercise.  Feeling good about her weight loss journey. Hypertension: Is having symptomatic orthostatic hypotensive symptoms.  Blood pressure is low normal today.  Taking amlodipine 2.5 mg daily and lisinopril 40 mg daily. Vitamin B12 and iron deficiency: Oral supplementation of both.  Tolerating well.  Anemia has resolved.  Feeling much better.  No longer fatigued Chronic depression: In remission.  Continue Celexa.  Wt Readings from Last 3 Encounters:  11/06/21 212 lb 3.2 oz (96.3 kg)  08/06/21 238 lb 8 oz (108.2 kg)  03/14/21 229 lb (103.9 kg)    BP Readings from Last 3 Encounters:  11/06/21 104/71  08/06/21 112/80  03/14/21 128/68    Assessment  1. Class 2 severe obesity due to excess calories with serious comorbidity and body mass index (BMI) of 39.0 to 39.9 in adult [facility])   2. Iron deficiency anemia due to chronic blood loss   3. B12 deficiency   4. Essential hypertension   5. Recurrent major depressive disorder, in full remission (HCC)      Plan  Obesity: Doing very well on GLP-1.  Continue 10 mg weekly.  Education on diet recommendations made.  Recheck 6 months. Iron deficiency anemia resolved.  Continues to have iron deficiency and B12.  Continue oral supplements.  We will recheck levels in May Hypertension, now with low blood pressures.  Stop amlodipine.  Monitor home blood pressures.  We will decrease lisinopril to 20 mg daily from 40 mg daily if still hypotensive.  Patient  understands and agrees with care plan.  She will send me a MyChart message if blood pressures remain low Depression remains stable.   Follow up: As scheduled, recheck blood pressure, iron and B12 levels 02/04/2022  No orders of the defined types were placed in this encounter.  No orders of the defined types were placed in this encounter.     I reviewed the patients updated PMH, FH, and SocHx.    Patient Active Problem List   Diagnosis Date Noted   History of total hip arthroplasty, left 08/06/2021   Chronic left shoulder pain 03/14/2020   History of gestational diabetes 03/14/2020   Iron deficiency anemia due to chronic blood loss 09/25/2017   Essential hypertension 06/06/2016   Gastroesophageal reflux disease without esophagitis 06/06/2016   Class 2 severe obesity due to excess calories with serious comorbidity and body mass index (BMI) of 35.0 to 35.9 in adult [facility]) 06/06/2016   Recurrent major depressive disorder, in full remission (HCC) 06/06/2016   IUD contraception 08/23/2012   Current Meds  Medication Sig   citalopram (CELEXA) 20 MG tablet Take 1 tablet (20 mg total) by mouth daily.   lisinopril (ZESTRIL) 40 MG tablet Take 1 tablet (40 mg total) by mouth daily.   omeprazole (PRILOSEC) 20 MG capsule Take 20 mg by mouth in the morning and at bedtime.   tirzepatide (MOUNJARO) 10 MG/0.5ML Pen Inject 10 mg into the  skin once a week.   [DISCONTINUED] amLODipine (NORVASC) 2.5 MG tablet TAKE 1 TABLET BY MOUTH EVERY DAY    Allergies: Patient is allergic to percocet [oxycodone-acetaminophen] and hydrocortisone. Family History: Patient family history includes ADD / ADHD in her brother; Frontotemporal dementia in her mother; Lupus in her brother. Social History:  Patient  reports that she has quit smoking. She has never used smokeless tobacco. She reports that she does not drink alcohol and does not use drugs.  Review of Systems: Constitutional: Negative for fever malaise or  anorexia Cardiovascular: negative for chest pain Respiratory: negative for SOB or persistent cough Gastrointestinal: negative for abdominal pain  Objective  Vitals: BP 104/71 (BP Location: Right Arm, Patient Position: Sitting, Cuff Size: Normal)    Pulse 84    Temp 97.9 F (36.6 C) (Temporal)    Ht 5\' 5"  (1.651 m)    Wt 212 lb 3.2 oz (96.3 kg)    SpO2 96%    BMI 35.31 kg/m  General: no acute distress , A&Ox3   Commons side effects, risks, benefits, and alternatives for medications and treatment plan prescribed today were discussed, and the patient expressed understanding of the given instructions. Patient is instructed to call or message via MyChart if he/she has any questions or concerns regarding our treatment plan. No barriers to understanding were identified. We discussed Red Flag symptoms and signs in detail. Patient expressed understanding regarding what to do in case of urgent or emergency type symptoms.  Medication list was reconciled, printed and provided to the patient in AVS. Patient instructions and summary information was reviewed with the patient as documented in the AVS. This note was prepared with assistance of Dragon voice recognition software. Occasional wrong-word or sound-a-like substitutions may have occurred due to the inherent limitations of voice recognition software  This visit occurred during the SARS-CoV-2 public health emergency.  Safety protocols were in place, including screening questions prior to the visit, additional usage of staff PPE, and extensive cleaning of exam room while observing appropriate contact time as indicated for disinfecting solutions.

## 2021-11-27 ENCOUNTER — Other Ambulatory Visit (HOSPITAL_BASED_OUTPATIENT_CLINIC_OR_DEPARTMENT_OTHER): Payer: Self-pay

## 2021-11-27 ENCOUNTER — Encounter: Payer: Self-pay | Admitting: Family Medicine

## 2021-11-27 ENCOUNTER — Other Ambulatory Visit: Payer: Self-pay

## 2021-11-27 MED ORDER — LISINOPRIL 40 MG PO TABS
40.0000 mg | ORAL_TABLET | Freq: Every day | ORAL | 3 refills | Status: DC
  Filled 2021-11-27: qty 90, 90d supply, fill #0

## 2021-12-02 ENCOUNTER — Other Ambulatory Visit (HOSPITAL_BASED_OUTPATIENT_CLINIC_OR_DEPARTMENT_OTHER): Payer: Self-pay

## 2021-12-03 ENCOUNTER — Other Ambulatory Visit (HOSPITAL_BASED_OUTPATIENT_CLINIC_OR_DEPARTMENT_OTHER): Payer: Self-pay

## 2021-12-03 MED ORDER — CELECOXIB 200 MG PO CAPS
ORAL_CAPSULE | ORAL | 1 refills | Status: DC
  Filled 2021-12-03: qty 30, 30d supply, fill #0

## 2021-12-12 ENCOUNTER — Encounter (HOSPITAL_BASED_OUTPATIENT_CLINIC_OR_DEPARTMENT_OTHER): Payer: Self-pay

## 2021-12-16 ENCOUNTER — Other Ambulatory Visit (HOSPITAL_BASED_OUTPATIENT_CLINIC_OR_DEPARTMENT_OTHER): Payer: Self-pay

## 2021-12-23 ENCOUNTER — Encounter: Payer: Self-pay | Admitting: Family Medicine

## 2021-12-24 MED ORDER — LISINOPRIL 40 MG PO TABS: 20.0000 mg | ORAL_TABLET | Freq: Every day | ORAL | 3 refills | Status: DC

## 2021-12-30 ENCOUNTER — Encounter: Payer: Self-pay | Admitting: Family Medicine

## 2021-12-30 ENCOUNTER — Other Ambulatory Visit (HOSPITAL_BASED_OUTPATIENT_CLINIC_OR_DEPARTMENT_OTHER): Payer: Self-pay

## 2021-12-31 LAB — HM PAP SMEAR

## 2022-01-20 ENCOUNTER — Other Ambulatory Visit (HOSPITAL_BASED_OUTPATIENT_CLINIC_OR_DEPARTMENT_OTHER): Payer: Self-pay

## 2022-01-28 ENCOUNTER — Other Ambulatory Visit (HOSPITAL_BASED_OUTPATIENT_CLINIC_OR_DEPARTMENT_OTHER): Payer: Self-pay

## 2022-01-28 MED ORDER — MELOXICAM 7.5 MG PO TABS
ORAL_TABLET | ORAL | 1 refills | Status: DC
  Filled 2022-01-28: qty 30, 30d supply, fill #0

## 2022-02-04 ENCOUNTER — Ambulatory Visit (INDEPENDENT_AMBULATORY_CARE_PROVIDER_SITE_OTHER): Admitting: Family Medicine

## 2022-02-04 ENCOUNTER — Encounter: Payer: Self-pay | Admitting: Family Medicine

## 2022-02-04 VITALS — BP 120/70 | HR 90 | Temp 98.1°F | Ht 65.0 in | Wt 198.2 lb

## 2022-02-04 DIAGNOSIS — E538 Deficiency of other specified B group vitamins: Secondary | ICD-10-CM | POA: Diagnosis not present

## 2022-02-04 DIAGNOSIS — D500 Iron deficiency anemia secondary to blood loss (chronic): Secondary | ICD-10-CM

## 2022-02-04 DIAGNOSIS — E66812 Obesity, class 2: Secondary | ICD-10-CM

## 2022-02-04 DIAGNOSIS — I10 Essential (primary) hypertension: Secondary | ICD-10-CM

## 2022-02-04 DIAGNOSIS — Z6835 Body mass index (BMI) 35.0-35.9, adult: Secondary | ICD-10-CM

## 2022-02-04 LAB — BASIC METABOLIC PANEL
BUN: 15 mg/dL (ref 6–23)
CO2: 29 mEq/L (ref 19–32)
Calcium: 9.4 mg/dL (ref 8.4–10.5)
Chloride: 103 mEq/L (ref 96–112)
Creatinine, Ser: 0.98 mg/dL (ref 0.40–1.20)
GFR: 73.25 mL/min (ref >60.00)
Glucose, Bld: 81 mg/dL (ref 70–99)
Potassium: 3.6 mEq/L (ref 3.5–5.1)
Sodium: 138 mEq/L (ref 135–145)

## 2022-02-04 LAB — VITAMIN B12: Vitamin B-12: 823 pg/mL (ref 211–911)

## 2022-02-04 LAB — CBC WITH DIFFERENTIAL/PLATELET
Basophils Absolute: 0 10*3/uL (ref 0.0–0.1)
Basophils Relative: 0.5 % (ref 0.0–3.0)
Eosinophils Absolute: 0.1 10*3/uL (ref 0.0–0.7)
Eosinophils Relative: 1.7 % (ref 0.0–5.0)
HCT: 39.7 % (ref 36.0–46.0)
Hemoglobin: 13.2 g/dL (ref 12.0–15.0)
Lymphocytes Relative: 27.9 % (ref 12.0–46.0)
Lymphs Abs: 1.8 10*3/uL (ref 0.7–4.0)
MCHC: 33.3 g/dL (ref 30.0–36.0)
MCV: 82.2 fl (ref 78.0–100.0)
Monocytes Absolute: 0.3 10*3/uL (ref 0.1–1.0)
Monocytes Relative: 4.5 % (ref 3.0–12.0)
Neutro Abs: 4.2 10*3/uL (ref 1.4–7.7)
Neutrophils Relative %: 65.4 % (ref 43.0–77.0)
Platelets: 271 10*3/uL (ref 150.0–400.0)
RBC: 4.83 Mil/uL (ref 3.87–5.11)
RDW: 15.6 % — ABNORMAL HIGH (ref 11.5–15.5)
WBC: 6.4 10*3/uL (ref 4.0–10.5)

## 2022-02-04 NOTE — Patient Instructions (Signed)
Please return in October for your annual complete physical; please come fasting.   I will release your lab results to you on your MyChart account with further instructions. You may see the results before I do, but when I review them I will send you a message with my report or have my assistant call you if things need to be discussed. Please reply to my message with any questions. Thank you!   If you have any questions or concerns, please don't hesitate to send me a message via MyChart or call the office at [phone]. Thank you for visiting with us today! It's our pleasure caring for you.  

## 2022-02-04 NOTE — Progress Notes (Signed)
? ?Subjective  ?CC:  ?Chief Complaint  ?Patient presents with  ? Hypertension  ?  Pt here to F/U with Bp  ? ? ? ?HPI: Debra Giles is a 41 y.o. female who presents to the office today to address the problems listed above in the chief complaint. ?Hypertension f/u: Control is good . Pt reports she is doing well.  Weight loss continues and she was running low on the 20 mg of lisinopril.  She stopped this 2 weeks and continues to monitor.  Blood pressure maintains in the normal range off blood pressure medicines. ?Obesity: Down 40 pounds on Mounjaro.  Tolerating well.  Discussed diet.  Eating very clean and healthy.  Working on getting enough protein.  Exercises but needs to do a bit more for her core.  Having some low back pain and seeing orthopedics. ?B12 deficiency and iron deficiency: Remains on supplements.  Tolerating iron 3 times daily.  B12 daily.  Due for recheck. ? ?Assessment  ?1. Essential hypertension   ?2. Class 2 severe obesity due to excess calories with serious comorbidity and body mass index (BMI) of 35.0 to 35.9 in adult [facility])   ?3. B12 deficiency   ?4. Iron deficiency anemia due to chronic blood loss   ? ?  ?Plan  ? ?Hypertension f/u: Now normotensive off medication.  We will continue to monitor.  Resolved with weight loss ?Obesity f/u: Reviewed diet in detail.  Recommended healthy protein sources.  Recommended strength training.  Recommended core exercises. ?Recheck B12 levels and iron levels. ?Education regarding management of these chronic disease states was given. Management strategies discussed on successive visits include dietary and exercise recommendations, goals of achieving and maintaining IBW, and lifestyle modifications aiming for adequate sleep and minimizing stressors.  ? ?Follow up: 6 months for recheck and physical ? ?Orders Placed This Encounter  ?Procedures  ? Basic metabolic panel  ? Vitamin B12  ? Iron, TIBC and Ferritin Panel  ? CBC with Differential/Platelet  ? ?No orders  of the defined types were placed in this encounter. ? ?  ? ?BP Readings from Last 3 Encounters:  ?02/04/22 120/70  ?11/06/21 104/71  ?08/06/21 112/80  ? ?Wt Readings from Last 3 Encounters:  ?02/04/22 198 lb 3.2 oz (89.9 kg)  ?11/06/21 212 lb 3.2 oz (96.3 kg)  ?08/06/21 238 lb 8 oz (108.2 kg)  ? ? ?Lab Results  ?Component Value Date  ? CHOL 194 08/06/2021  ? ?Lab Results  ?Component Value Date  ? HDL 42.50 08/06/2021  ? ?Lab Results  ?Component Value Date  ? LDLCALC 131 (H) 08/06/2021  ? ?Lab Results  ?Component Value Date  ? TRIG 103.0 08/06/2021  ? ?Lab Results  ?Component Value Date  ? CHOLHDL 5 08/06/2021  ? ?No results found for: LDLDIRECT ?Lab Results  ?Component Value Date  ? CREATININE 0.67 08/06/2021  ? BUN 12 08/06/2021  ? NA 138 08/06/2021  ? K 4.1 08/06/2021  ? CL 101 08/06/2021  ? CO2 30 08/06/2021  ? ? ?The ASCVD Risk score (Arnett DK, et al., 2019) failed to calculate for the following reasons: ?  The 2019 ASCVD risk score is only valid for ages 63 to 67 ? ?I reviewed the patients updated PMH, FH, and SocHx.  ?  ?Patient Active Problem List  ? Diagnosis Date Noted  ? B12 deficiency 02/04/2022  ? History of total hip arthroplasty, left 08/06/2021  ? Chronic left shoulder pain 03/14/2020  ? History of gestational diabetes 03/14/2020  ?  Iron deficiency anemia due to chronic blood loss 09/25/2017  ? Essential hypertension 06/06/2016  ? Gastroesophageal reflux disease without esophagitis 06/06/2016  ? Class 2 severe obesity due to excess calories with serious comorbidity and body mass index (BMI) of 35.0 to 35.9 in adult [facility]) 06/06/2016  ? Recurrent major depressive disorder, in full remission (HCC) 06/06/2016  ? IUD contraception 08/23/2012  ? ? ?Allergies: Percocet [oxycodone-acetaminophen] and Hydrocortisone ? ?Social History: ?Patient  reports that she has quit smoking. She has never used smokeless tobacco. She reports that she does not drink alcohol and does not use drugs. ? ?Current Meds   ?Medication Sig  ? celecoxib (CELEBREX) 200 MG capsule Take 1 capsule every day by oral route as needed for 30 days.  ? citalopram (CELEXA) 20 MG tablet Take 1 tablet (20 mg total) by mouth daily.  ? meloxicam (MOBIC) 7.5 MG tablet Take 1 tablet every day by oral route as needed for 30 days.  ? omeprazole (PRILOSEC) 20 MG capsule Take 20 mg by mouth in the morning and at bedtime.  ? tirzepatide (MOUNJARO) 10 MG/0.5ML Pen Inject 10 mg into the skin once a week.  ? [DISCONTINUED] lisinopril (ZESTRIL) 40 MG tablet Take 0.5 tablets (20 mg total) by mouth daily.  ? ? ?Review of Systems: ?Cardiovascular: negative for chest pain, palpitations, leg swelling, orthopnea ?Respiratory: negative for SOB, wheezing or persistent cough ?Gastrointestinal: negative for abdominal pain ?Genitourinary: negative for dysuria or gross hematuria ? ?Objective  ?Vitals: BP 120/70   Pulse 90   Temp 98.1 ?F (36.7 ?C)   Ht 5\' 5"  (1.651 m)   Wt 198 lb 3.2 oz (89.9 kg)   SpO2 99%   BMI 32.98 kg/m?  ?General: no acute distress  ?Psych:  Alert and oriented, normal mood and affect ?HEENT:  Normocephalic, atraumatic, supple neck  ?Cardiovascular:  RRR without murmur. no edema ?Respiratory:  Good breath sounds bilaterally, CTAB with normal respiratory effort ? ?Commons side effects, risks, benefits, and alternatives for medications and treatment plan prescribed today were discussed, and the patient expressed understanding of the given instructions. Patient is instructed to call or message via MyChart if he/she has any questions or concerns regarding our treatment plan. No barriers to understanding were identified. We discussed Red Flag symptoms and signs in detail. Patient expressed understanding regarding what to do in case of urgent or emergency type symptoms.  ?Medication list was reconciled, printed and provided to the patient in AVS. Patient instructions and summary information was reviewed with the patient as documented in the AVS. ?This  note was prepared with assistance of . Occasional wrong-word or sound-a-like substitutions may have occurred due to the inherent limitations of voice recognition software ? ?This visit occurred during the SARS-CoV-2 public health emergency.  Safety protocols were in place, including screening questions prior to the visit, additional usage of staff PPE, and extensive cleaning of exam room while observing appropriate contact time as indicated for disinfecting solutions.  ?

## 2022-02-05 LAB — IRON,TIBC AND FERRITIN PANEL
%SAT: 15 % (calc) — ABNORMAL LOW (ref 16–45)
Ferritin: 12 ng/mL — ABNORMAL LOW (ref 16–154)
Iron: 46 ug/dL (ref 40–190)
TIBC: 299 mcg/dL (calc) (ref 250–450)

## 2022-02-26 ENCOUNTER — Encounter: Payer: Self-pay | Admitting: Family Medicine

## 2022-02-26 ENCOUNTER — Other Ambulatory Visit (HOSPITAL_BASED_OUTPATIENT_CLINIC_OR_DEPARTMENT_OTHER): Payer: Self-pay

## 2022-02-27 ENCOUNTER — Encounter: Payer: Self-pay | Admitting: Family Medicine

## 2022-02-28 ENCOUNTER — Other Ambulatory Visit (HOSPITAL_BASED_OUTPATIENT_CLINIC_OR_DEPARTMENT_OTHER): Payer: Self-pay

## 2022-02-28 MED ORDER — TIRZEPATIDE 7.5 MG/0.5ML ~~LOC~~ SOAJ: 7.5000 mg | SUBCUTANEOUS | 1 refills | Status: DC

## 2022-03-24 ENCOUNTER — Encounter (HOSPITAL_BASED_OUTPATIENT_CLINIC_OR_DEPARTMENT_OTHER): Payer: Self-pay

## 2022-03-24 ENCOUNTER — Other Ambulatory Visit (HOSPITAL_BASED_OUTPATIENT_CLINIC_OR_DEPARTMENT_OTHER): Payer: Self-pay

## 2022-03-24 ENCOUNTER — Other Ambulatory Visit: Payer: Self-pay | Admitting: Family Medicine

## 2022-03-24 ENCOUNTER — Encounter: Payer: Self-pay | Admitting: Family Medicine

## 2022-03-24 MED ORDER — TIRZEPATIDE 10 MG/0.5ML ~~LOC~~ SOAJ: 10.0000 mg | SUBCUTANEOUS | 1 refills | Status: DC

## 2022-03-25 ENCOUNTER — Other Ambulatory Visit (HOSPITAL_BASED_OUTPATIENT_CLINIC_OR_DEPARTMENT_OTHER): Payer: Self-pay

## 2022-03-25 ENCOUNTER — Other Ambulatory Visit: Payer: Self-pay

## 2022-03-25 ENCOUNTER — Encounter (HOSPITAL_BASED_OUTPATIENT_CLINIC_OR_DEPARTMENT_OTHER): Payer: Self-pay

## 2022-03-25 MED ORDER — TIRZEPATIDE 10 MG/0.5ML ~~LOC~~ SOAJ
10.0000 mg | SUBCUTANEOUS | 1 refills | Status: DC
  Filled 2022-03-25: qty 2, 28d supply, fill #0

## 2022-03-31 ENCOUNTER — Other Ambulatory Visit: Payer: Self-pay | Admitting: Family Medicine

## 2022-03-31 ENCOUNTER — Other Ambulatory Visit (HOSPITAL_BASED_OUTPATIENT_CLINIC_OR_DEPARTMENT_OTHER): Payer: Self-pay

## 2022-04-01 ENCOUNTER — Other Ambulatory Visit (HOSPITAL_BASED_OUTPATIENT_CLINIC_OR_DEPARTMENT_OTHER): Payer: Self-pay

## 2022-04-04 ENCOUNTER — Encounter: Payer: Self-pay | Admitting: Family Medicine

## 2022-04-07 MED ORDER — TIRZEPATIDE 7.5 MG/0.5ML ~~LOC~~ SOAJ: 7.5000 mg | SUBCUTANEOUS | 2 refills | Status: DC

## 2022-05-13 ENCOUNTER — Other Ambulatory Visit (HOSPITAL_BASED_OUTPATIENT_CLINIC_OR_DEPARTMENT_OTHER): Payer: Self-pay

## 2022-05-20 NOTE — Progress Notes (Deleted)
New Patient Note  RE: Debra Giles MRN: [id number] DOB: December 07, 1980 Date of Office Visit: 05/21/2022  Consult requested by: Willow Ora, MD Primary care provider: Willow Ora, MD  Chief Complaint: No chief complaint on file.  History of Present Illness: I had the pleasure of seeing Debra Giles for initial evaluation at the [facility] on 05/20/2022. She is a 41 y.o. female, who is referred here by Willow Ora, MD for the evaluation of ***.  ***  Assessment and Plan: Clay is a 41 y.o. female with: No problem-specific Assessment & Plan notes found for this encounter.  No follow-ups on file.  No orders of the defined types were placed in this encounter.  Lab Orders  No laboratory test(s) ordered today    Other allergy screening: Asthma: {Blank single:19197::"yes","no"} Rhino conjunctivitis: {Blank single:19197::"yes","no"} Food allergy: {Blank single:19197::"yes","no"} Medication allergy: {Blank single:19197::"yes","no"} Hymenoptera allergy: {Blank single:19197::"yes","no"} Urticaria: {Blank single:19197::"yes","no"} Eczema:{Blank single:19197::"yes","no"} History of recurrent infections suggestive of immunodeficency: {Blank single:19197::"yes","no"}  Diagnostics: Spirometry:  Tracings reviewed. Her effort: {Blank single:19197::"Good reproducible efforts.","It was hard to get consistent efforts and there is a question as to whether this reflects a maximal maneuver.","Poor effort, data can not be interpreted."} FVC: ***L FEV1: ***L, ***% predicted FEV1/FVC ratio: ***% Interpretation: {Blank single:19197::"Spirometry consistent with mild obstructive disease","Spirometry consistent with moderate obstructive disease","Spirometry consistent with severe obstructive disease","Spirometry consistent with possible restrictive disease","Spirometry consistent with mixed obstructive and restrictive disease","Spirometry uninterpretable due to  technique","Spirometry consistent with normal pattern","No overt abnormalities noted given today's efforts"}.  Please see scanned spirometry results for details.  Skin Testing: {Blank single:19197::"Select foods","Environmental allergy panel","Environmental allergy panel and select foods","Food allergy panel","None","Deferred due to recent antihistamines use"}. *** Results discussed with patient/family.   Past Medical History: Patient Active Problem List   Diagnosis Date Noted  . B12 deficiency 02/04/2022  . History of total hip arthroplasty, left 08/06/2021  . Chronic left shoulder pain 03/14/2020  . History of gestational diabetes 03/14/2020  . Iron deficiency anemia due to chronic blood loss 09/25/2017  . Essential hypertension 06/06/2016  . Gastroesophageal reflux disease without esophagitis 06/06/2016  . Class 2 severe obesity due to excess calories with serious comorbidity and body mass index (BMI) of 35.0 to 35.9 in adult (HCC) 06/06/2016  . Recurrent major depressive disorder, in full remission (HCC) 06/06/2016  . IUD contraception 08/23/2012   Past Medical History:  Diagnosis Date  . Anemia    with pregnancy  . Anxiety    celexa  . Arthritis   . Complication of anesthesia   . Depression    mild PP depression no treatment  . GERD (gastroesophageal reflux disease)   . Gestational diabetes    hx of gestational diabetes in 2021 no longer a diabetic per pt   . History of HELLP syndrome, currently pregnant   . Hypertension   . Pneumonia    hx of   . PONV (postoperative nausea and vomiting)   . Urticaria    Past Surgical History: Past Surgical History:  Procedure Laterality Date  . CESAREAN SECTION  02/20/2008  . CESAREAN SECTION WITH BILATERAL TUBAL LIGATION N/A 01/16/2020   c section   . HIP SURGERY Left 1997  . TOTAL HIP ARTHROPLASTY Left 03/14/2021   Procedure: TOTAL HIP ARTHROPLASTY ANTERIOR APPROACH w/ hardware removal;  Surgeon: Samson Frederic, MD;  Location:  [facility];  Service: Orthopedics;  Laterality: Left;   Medication List:  Current Outpatient Medications  Medication Sig Dispense Refill  .  celecoxib (CELEBREX) 200 MG capsule Take 1 capsule every day by oral route as needed for 30 days. 30 capsule 1  . citalopram (CELEXA) 20 MG tablet Take 1 tablet (20 mg total) by mouth daily. 90 tablet 3  . meloxicam (MOBIC) 7.5 MG tablet Take 1 tablet every day by oral route as needed for 30 days. 30 tablet 1  . omeprazole (PRILOSEC) 20 MG capsule Take 20 mg by mouth in the morning and at bedtime.    . tirzepatide (MOUNJARO) 7.5 MG/0.5ML Pen Inject 7.5 mg into the skin once a week. 6 mL 2   No current facility-administered medications for this visit.   Allergies: Allergies  Allergen Reactions  . Percocet [Oxycodone-Acetaminophen] Itching    Pt can tolerate acetaminophen  . Hydrocortisone Rash   Social History: Social History   Socioeconomic History  . Marital status: Married    Spouse name: Not on file  . Number of children: Not on file  . Years of education: Not on file  . Highest education level: Not on file  Occupational History  . Not on file  Tobacco Use  . Smoking status: Former  . Smokeless tobacco: Never  Vaping Use  . Vaping Use: Never used  Substance and Sexual Activity  . Alcohol use: No  . Drug use: No  . Sexual activity: Yes  Other Topics Concern  . Not on file  Social History Narrative  . Not on file   Social Determinants of Health   Financial Resource Strain: Not on file  Food Insecurity: Not on file  Transportation Needs: Not on file  Physical Activity: Not on file  Stress: Not on file  Social Connections: Not on file   Lives in a ***. Smoking: *** Occupation: ***  Environmental HistorySurveyor, minerals in the house: Copywriter, advertising in the family room: {Blank single:19197::"yes","no"} Carpet in the bedroom: {Blank single:19197::"yes","no"} Heating: {Blank  single:19197::"electric","gas","heat pump"} Cooling: {Blank single:19197::"central","window","heat pump"} Pet: {Blank single:19197::"yes ***","no"}  Family History: Family History  Problem Relation Age of Onset  . Frontotemporal dementia Mother        Primary Progressive Aphasia   . ADD / ADHD Brother   . Lupus Brother    Problem                               Relation Asthma                                   *** Eczema                                *** Food allergy                          *** Allergic rhino conjunctivitis     ***  Review of Systems  Constitutional:  Negative for appetite change, chills, fever and unexpected weight change.  HENT:  Negative for congestion and rhinorrhea.   Eyes:  Negative for itching.  Respiratory:  Negative for cough, chest tightness, shortness of breath and wheezing.   Cardiovascular:  Negative for chest pain.  Gastrointestinal:  Negative for abdominal pain.  Genitourinary:  Negative for difficulty urinating.  Skin:  Negative for rash.  Neurological:  Negative for headaches.   Objective: There were  no vitals taken for this visit. There is no height or weight on file to calculate BMI. Physical Exam Vitals and nursing note reviewed.  Constitutional:      Appearance: Normal appearance. She is well-developed.  HENT:     Head: Normocephalic and atraumatic.     Right Ear: Tympanic membrane and external ear normal.     Left Ear: Tympanic membrane and external ear normal.     Nose: Nose normal.     Mouth/Throat:     Mouth: Mucous membranes are moist.     Pharynx: Oropharynx is clear.  Eyes:     Conjunctiva/sclera: Conjunctivae normal.  Cardiovascular:     Rate and Rhythm: Normal rate and regular rhythm.     Heart sounds: Normal heart sounds. No murmur heard.    No friction rub. No gallop.  Pulmonary:     Effort: Pulmonary effort is normal.     Breath sounds: Normal breath sounds. No wheezing, rhonchi or rales.  Musculoskeletal:      Cervical back: Neck supple.  Skin:    General: Skin is warm.     Findings: No rash.  Neurological:     Mental Status: She is alert and oriented to person, place, and time.  Psychiatric:        Behavior: Behavior normal.  The plan was reviewed with the patient/family, and all questions/concerned were addressed.  It was my pleasure to see Faron today and participate in her care. Please feel free to contact me with any questions or concerns.  Sincerely,  Wyline Mood, DO Allergy & Immunology  [facility] office: [phone] [facility] office: [phone]

## 2022-05-21 ENCOUNTER — Ambulatory Visit: Admitting: Allergy

## 2022-07-08 NOTE — Progress Notes (Deleted)
New Patient Note  RE: Debra Giles MRN: [id number] DOB: Dec 07, 1980 Date of Office Visit: 07/09/2022  Consult requested by: Leamon Arnt, MD Primary care provider: Leamon Arnt, MD  Chief Complaint: No chief complaint on file.  History of Present Illness: I had the pleasure of seeing Debra Giles for initial evaluation at the [facility] [place] on 07/08/2022. She is a 41 y.o. female, who is referred here by Leamon Arnt, MD for the evaluation of ***.  ***  Assessment and Plan: Debra Giles is a 41 y.o. female with: No problem-specific Assessment & Plan notes found for this encounter.  No follow-ups on file.  No orders of the defined types were placed in this encounter.  Lab Orders  No laboratory test(s) ordered today    Other allergy screening: Asthma: {Blank single:19197::"yes","no"} Rhino conjunctivitis: {Blank single:19197::"yes","no"} Food allergy: {Blank single:19197::"yes","no"} Medication allergy: {Blank single:19197::"yes","no"} Hymenoptera allergy: {Blank single:19197::"yes","no"} Urticaria: {Blank single:19197::"yes","no"} Eczema:{Blank single:19197::"yes","no"} History of recurrent infections suggestive of immunodeficency: {Blank single:19197::"yes","no"}  Diagnostics: Spirometry:  Tracings reviewed. Her effort: {Blank single:19197::"Good reproducible efforts.","It was hard to get consistent efforts and there is a question as to whether this reflects a maximal maneuver.","Poor effort, data can not be interpreted."} FVC: ***L FEV1: ***L, ***% predicted FEV1/FVC ratio: ***% Interpretation: {Blank single:19197::"Spirometry consistent with mild obstructive disease","Spirometry consistent with moderate obstructive disease","Spirometry consistent with severe obstructive disease","Spirometry consistent with possible restrictive disease","Spirometry consistent with mixed obstructive and restrictive disease","Spirometry uninterpretable due to  technique","Spirometry consistent with normal pattern","No overt abnormalities noted given today's efforts"}.  Please see scanned spirometry results for details.  Skin Testing: {Blank single:19197::"Select foods","Environmental allergy panel","Environmental allergy panel and select foods","Food allergy panel","None","Deferred due to recent antihistamines use"}. *** Results discussed with patient/family.   Past Medical History: Patient Active Problem List   Diagnosis Date Noted  . B12 deficiency 02/04/2022  . History of total hip arthroplasty, left 08/06/2021  . Chronic left shoulder pain 03/14/2020  . History of gestational diabetes 03/14/2020  . Iron deficiency anemia due to chronic blood loss 09/25/2017  . Essential hypertension 06/06/2016  . Gastroesophageal reflux disease without esophagitis 06/06/2016  . Class 2 severe obesity due to excess calories with serious comorbidity and body mass index (BMI) of 35.0 to 35.9 in adult ([place]) 06/06/2016  . Recurrent major depressive disorder, in full remission ([place]) 06/06/2016  . IUD contraception 08/23/2012   Past Medical History:  Diagnosis Date  . Anemia    with pregnancy  . Anxiety    celexa  . Arthritis   . Complication of anesthesia   . Depression    mild PP depression no treatment  . GERD (gastroesophageal reflux disease)   . Gestational diabetes    hx of gestational diabetes in 2021 no longer a diabetic per pt   . History of HELLP syndrome, currently pregnant   . Hypertension   . Pneumonia    hx of   . PONV (postoperative nausea and vomiting)   . Urticaria    Past Surgical History: Past Surgical History:  Procedure Laterality Date  . CESAREAN SECTION  02/20/2008  . CESAREAN SECTION WITH BILATERAL TUBAL LIGATION N/A 01/16/2020   c section   . HIP SURGERY Left 1997  . TOTAL HIP ARTHROPLASTY Left 03/14/2021   Procedure: TOTAL HIP ARTHROPLASTY ANTERIOR APPROACH w/ hardware removal;  Surgeon: Rod Can, MD;  Location:  [facility];  Service: Orthopedics;  Laterality: Left;   Medication List:  Current Outpatient Medications  Medication Sig Dispense Refill  .  celecoxib (CELEBREX) 200 MG capsule Take 1 capsule every day by oral route as needed for 30 days. 30 capsule 1  . citalopram (CELEXA) 20 MG tablet Take 1 tablet (20 mg total) by mouth daily. 90 tablet 3  . meloxicam (MOBIC) 7.5 MG tablet Take 1 tablet every day by oral route as needed for 30 days. 30 tablet 1  . omeprazole (PRILOSEC) 20 MG capsule Take 20 mg by mouth in the morning and at bedtime.    . tirzepatide (MOUNJARO) 7.5 MG/0.5ML Pen Inject 7.5 mg into the skin once a week. 6 mL 2   No current facility-administered medications for this visit.   Allergies: Allergies  Allergen Reactions  . Percocet [Oxycodone-Acetaminophen] Itching    Pt can tolerate acetaminophen  . Hydrocortisone Rash   Social History: Social History   Socioeconomic History  . Marital status: Married    Spouse name: Not on file  . Number of children: Not on file  . Years of education: Not on file  . Highest education level: Not on file  Occupational History  . Not on file  Tobacco Use  . Smoking status: Former  . Smokeless tobacco: Never  Vaping Use  . Vaping Use: Never used  Substance and Sexual Activity  . Alcohol use: No  . Drug use: No  . Sexual activity: Yes  Other Topics Concern  . Not on file  Social History Narrative  . Not on file   Social Determinants of Health   Financial Resource Strain: Not on file  Food Insecurity: Not on file  Transportation Needs: Not on file  Physical Activity: Not on file  Stress: Not on file  Social Connections: Not on file   Lives in a ***. Smoking: *** Occupation: ***  Environmental HistorySurveyor, minerals in the house: Copywriter, advertising in the family room: {Blank single:19197::"yes","no"} Carpet in the bedroom: {Blank single:19197::"yes","no"} Heating: {Blank  single:19197::"electric","gas","heat pump"} Cooling: {Blank single:19197::"central","window","heat pump"} Pet: {Blank single:19197::"yes ***","no"}  Family History: Family History  Problem Relation Age of Onset  . Frontotemporal dementia Mother        Primary Progressive Aphasia   . ADD / ADHD Brother   . Lupus Brother    Problem                               Relation Asthma                                   *** Eczema                                *** Food allergy                          *** Allergic rhino conjunctivitis     ***  Review of Systems  Constitutional:  Negative for appetite change, chills, fever and unexpected weight change.  HENT:  Negative for congestion and rhinorrhea.   Eyes:  Negative for itching.  Respiratory:  Negative for cough, chest tightness, shortness of breath and wheezing.   Cardiovascular:  Negative for chest pain.  Gastrointestinal:  Negative for abdominal pain.  Genitourinary:  Negative for difficulty urinating.  Skin:  Negative for rash.  Neurological:  Negative for headaches.   Objective: There were  no vitals taken for this visit. There is no height or weight on file to calculate BMI. Physical Exam Vitals and nursing note reviewed.  Constitutional:      Appearance: Normal appearance. She is well-developed.  HENT:     Head: Normocephalic and atraumatic.     Right Ear: Tympanic membrane and external ear normal.     Left Ear: Tympanic membrane and external ear normal.     Nose: Nose normal.     Mouth/Throat:     Mouth: Mucous membranes are moist.     Pharynx: Oropharynx is clear.  Eyes:     Conjunctiva/sclera: Conjunctivae normal.  Cardiovascular:     Rate and Rhythm: Normal rate and regular rhythm.     Heart sounds: Normal heart sounds. No murmur heard.    No friction rub. No gallop.  Pulmonary:     Effort: Pulmonary effort is normal.     Breath sounds: Normal breath sounds. No wheezing, rhonchi or rales.  Musculoskeletal:      Cervical back: Neck supple.  Skin:    General: Skin is warm.     Findings: No rash.  Neurological:     Mental Status: She is alert and oriented to person, place, and time.  Psychiatric:        Behavior: Behavior normal.  The plan was reviewed with the patient/family, and all questions/concerned were addressed.  It was my pleasure to see Debra Giles today and participate in her care. Please feel free to contact me with any questions or concerns.  Sincerely,  Wyline Mood, DO Allergy & Immunology  [facility] office: [phone] [facility] office: [phone]

## 2022-07-09 ENCOUNTER — Ambulatory Visit: Admitting: Allergy

## 2022-07-21 ENCOUNTER — Encounter: Payer: Self-pay | Admitting: Family Medicine

## 2022-07-21 MED ORDER — TIRZEPATIDE 10 MG/0.5ML ~~LOC~~ SOAJ: 10.0000 mg | SUBCUTANEOUS | 5 refills | Status: DC

## 2022-08-11 ENCOUNTER — Encounter: Admitting: Family Medicine

## 2022-09-03 ENCOUNTER — Ambulatory Visit (INDEPENDENT_AMBULATORY_CARE_PROVIDER_SITE_OTHER): Admitting: Family Medicine

## 2022-09-03 ENCOUNTER — Encounter: Payer: Self-pay | Admitting: Family Medicine

## 2022-09-03 VITALS — BP 100/60 | HR 92 | Temp 98.1°F | Ht 65.0 in | Wt 179.8 lb

## 2022-09-03 DIAGNOSIS — Z6835 Body mass index (BMI) 35.0-35.9, adult: Secondary | ICD-10-CM

## 2022-09-03 DIAGNOSIS — Z975 Presence of (intrauterine) contraceptive device: Secondary | ICD-10-CM | POA: Diagnosis not present

## 2022-09-03 DIAGNOSIS — I10 Essential (primary) hypertension: Secondary | ICD-10-CM | POA: Diagnosis not present

## 2022-09-03 DIAGNOSIS — E538 Deficiency of other specified B group vitamins: Secondary | ICD-10-CM

## 2022-09-03 DIAGNOSIS — Z96642 Presence of left artificial hip joint: Secondary | ICD-10-CM | POA: Diagnosis not present

## 2022-09-03 DIAGNOSIS — K219 Gastro-esophageal reflux disease without esophagitis: Secondary | ICD-10-CM | POA: Diagnosis not present

## 2022-09-03 DIAGNOSIS — D500 Iron deficiency anemia secondary to blood loss (chronic): Secondary | ICD-10-CM

## 2022-09-03 DIAGNOSIS — Z0000 Encounter for general adult medical examination without abnormal findings: Secondary | ICD-10-CM | POA: Diagnosis not present

## 2022-09-03 DIAGNOSIS — F3342 Major depressive disorder, recurrent, in full remission: Secondary | ICD-10-CM

## 2022-09-03 DIAGNOSIS — L2084 Intrinsic (allergic) eczema: Secondary | ICD-10-CM

## 2022-09-03 HISTORY — DX: Intrinsic (allergic) eczema: L20.84

## 2022-09-03 LAB — CBC WITH DIFFERENTIAL/PLATELET
Basophils Absolute: 0 10*3/uL (ref 0.0–0.1)
Basophils Relative: 0.5 % (ref 0.0–3.0)
Eosinophils Absolute: 0.2 10*3/uL (ref 0.0–0.7)
Eosinophils Relative: 2.3 % (ref 0.0–5.0)
HCT: 42.1 % (ref 36.0–46.0)
Hemoglobin: 14.4 g/dL (ref 12.0–15.0)
Lymphocytes Relative: 30.4 % (ref 12.0–46.0)
Lymphs Abs: 2.1 10*3/uL (ref 0.7–4.0)
MCHC: 34.1 g/dL (ref 30.0–36.0)
MCV: 82.9 fl (ref 78.0–100.0)
Monocytes Absolute: 0.4 10*3/uL (ref 0.1–1.0)
Monocytes Relative: 5.2 % (ref 3.0–12.0)
Neutro Abs: 4.2 10*3/uL (ref 1.4–7.7)
Neutrophils Relative %: 61.6 % (ref 43.0–77.0)
Platelets: 253 10*3/uL (ref 150.0–400.0)
RBC: 5.08 Mil/uL (ref 3.87–5.11)
RDW: 13.7 % (ref 11.5–15.5)
WBC: 6.8 10*3/uL (ref 4.0–10.5)

## 2022-09-03 LAB — LIPID PANEL
Cholesterol: 176 mg/dL (ref 0–200)
HDL: 44.9 mg/dL (ref >39.00)
LDL Cholesterol: 116 mg/dL — ABNORMAL HIGH (ref 0–99)
NonHDL: 131.28
Total CHOL/HDL Ratio: 4
Triglycerides: 77 mg/dL (ref 0.0–149.0)
VLDL: 15.4 mg/dL (ref 0.0–40.0)

## 2022-09-03 LAB — COMPREHENSIVE METABOLIC PANEL
ALT: 11 U/L (ref 0–35)
AST: 13 U/L (ref 0–37)
Albumin: 4.1 g/dL (ref 3.5–5.2)
Alkaline Phosphatase: 35 U/L — ABNORMAL LOW (ref 39–117)
BUN: 20 mg/dL (ref 6–23)
CO2: 30 mEq/L (ref 19–32)
Calcium: 9.2 mg/dL (ref 8.4–10.5)
Chloride: 104 mEq/L (ref 96–112)
Creatinine, Ser: 0.77 mg/dL (ref 0.40–1.20)
GFR: 97.43 mL/min (ref >60.00)
Glucose, Bld: 85 mg/dL (ref 70–99)
Potassium: 4.4 mEq/L (ref 3.5–5.1)
Sodium: 139 mEq/L (ref 135–145)
Total Bilirubin: 1.3 mg/dL — ABNORMAL HIGH (ref 0.2–1.2)
Total Protein: 6.6 g/dL (ref 6.0–8.3)

## 2022-09-03 LAB — HEMOGLOBIN A1C: Hgb A1c MFr Bld: 5.2 % (ref 4.6–6.5)

## 2022-09-03 LAB — TSH: TSH: 2.11 u[IU]/mL (ref 0.35–5.50)

## 2022-09-03 LAB — VITAMIN B12: Vitamin B-12: 1044 pg/mL — ABNORMAL HIGH (ref 211–911)

## 2022-09-03 MED ORDER — TIRZEPATIDE 12.5 MG/0.5ML ~~LOC~~ SOAJ
12.5000 mg | SUBCUTANEOUS | 5 refills | Status: DC
  Filled 2022-09-22 – 2022-09-23 (×2): qty 2, 28d supply, fill #0
  Filled 2022-10-17: qty 2, 28d supply, fill #1
  Filled 2022-11-13: qty 2, 28d supply, fill #2
  Filled 2022-12-12: qty 2, 28d supply, fill #3
  Filled 2023-01-27 – 2023-02-19 (×3): qty 2, 28d supply, fill #4
  Filled 2023-03-17: qty 2, 28d supply, fill #5
  Filled 2023-04-23: qty 2, 28d supply, fill #6
  Filled 2023-05-17: qty 2, 28d supply, fill #7

## 2022-09-03 NOTE — Patient Instructions (Signed)

## 2022-09-03 NOTE — Progress Notes (Signed)
Subjective  Chief Complaint  Patient presents with   Annual Exam    Pt here for Annual Exam and is not currently fasting     HPI: Debra Giles is a 41 y.o. female who presents to [facility] [place] today for a Female Wellness Visit.  She also has the concerns and/or needs as listed above in the chief complaint. These will be addressed in addition to the Health Maintenance Visit.   Wellness Visit: annual visit with health maintenance review and exam without Pap  HM: sees ob/gyn; had pap this year. Normal. Feels great! Exercising and eating well. Taking nutritional supplements. No pain; sleep is good, mood is fully recovered.  Chronic disease management visit and/or acute problem visit: H/o HTN: now resolved with weight loss. Bp is normal.  Obesity: bmi now < 30. Down 60 pounds in about a year on mounjaro. Has done very well. No side effects. Weight loss has slowed and she finds this frustrating. We discussed body's response weight loss; she is exercising 5x/week. Now on 10mg  weekly;  IUD contraception; amenorrheic Depression; no sxs. Remains in remission. On chronic celexa 20 daily H/o b12 and iron deficiency; nutritional/gerd and h/o surgery. Anemia had resolved. No sxs. Due for recheck to see if iron stores are fully replenished GERD: well controlled on chronic ppi C/o eczema; in front of right ear; itchy. Small patch. Can't use steroid creams due to allergy  Assessment  1. Annual physical exam   2. Essential hypertension   3. IUD contraception   4. Class 2 severe obesity due to excess calories with serious comorbidity and body mass index (BMI) of 35.0 to 35.9 in adult (HCC)   5. History of total hip arthroplasty, left   6. B12 deficiency   7. Gastroesophageal reflux disease without esophagitis   8. Iron deficiency anemia due to chronic blood loss   9. Recurrent major depressive disorder, in full remission (HCC)   10. Intrinsic atopic dermatitis       Plan  Female Wellness Visit: Age appropriate Health Maintenance and Prevention measures were discussed with patient. Included topics are cancer screening recommendations, ways to keep healthy (see AVS) including dietary and exercise recommendations, regular eye and dental care, use of seat belts, and avoidance of moderate alcohol use and tobacco use. current BMI: discussed patient's BMI and encouraged positive lifestyle modifications to help get to or maintain a target BMI. HM needs and immunizations were addressed and ordered. See below for orders. See HM and immunization section for updates. Declines flu vaccine Routine labs and screening tests ordered including cmp, cbc and lipids where appropriate. Discussed recommendations regarding Vit D and calcium supplementation (see AVS)  Chronic disease f/u and/or acute problem visit: (deemed necessary to be done in addition to the wellness visit): obesity:  doing very well. Will increase mounjaro to 12.5; continue slow steady weight loss. Goal is 150lb. Monitor liver function. If weight loss completely plateaus, can consider adjusting down SSRI H/o HTN: now normotensive. Congratulated Recheck b12 and iron levels and cbc Continue omeprazole 20 daily well controlled gerd Start itch-x or topical benadryl cream for symptomatic eczema and vaseline ointment to moisturize Depression: continue celexz 20; well controlled.   Follow up: 12 mo for cpe   Orders Placed This Encounter  Procedures   CBC with Differential/Platelet   Comprehensive metabolic panel   Lipid panel   Hemoglobin A1c   Iron, TIBC and Ferritin Panel   TSH   Vitamin B12  Meds ordered this encounter  Medications   tirzepatide (MOUNJARO) 12.5 MG/0.5ML Pen    Sig: Inject 12.5 mg into the skin once a week.    Dispense:  6 mL    Refill:  5       Body mass index is 29.92 kg/m. Wt Readings from Last 3 Encounters:  09/03/22 179 lb 12.8 oz (81.6 kg)  02/04/22 198 lb 3.2 oz (89.9  kg)  11/06/21 212 lb 3.2 oz (96.3 kg)   Need for contraception: Yes, IUD  Patient Active Problem List   Diagnosis Date Noted   Intrinsic atopic dermatitis 09/03/2022   B12 deficiency 02/04/2022   History of total hip arthroplasty, left 08/06/2021    2022    Chronic left shoulder pain 03/14/2020   History of gestational diabetes 03/14/2020   Iron deficiency anemia due to chronic blood loss 09/25/2017   History of hypertension 06/06/2016    Resolved with weight loss 2023; no meds now Dxd 2015; well controlled. Strong FH on HTN    Gastroesophageal reflux disease without esophagitis 06/06/2016   Class 2 severe obesity due to excess calories with serious comorbidity and body mass index (BMI) of 35.0 to 35.9 in adult [facility]) 06/06/2016    Formatting of this note might be different from the original. IMO 2019 R1.0 Update    Recurrent major depressive disorder, in full remission (HCC) 06/06/2016    On chronic celexa    IUD contraception 08/23/2012   Health Maintenance  Topic Date Due   COVID-19 Vaccine (1) 09/19/2022 (Originally 02/19/1982)   PAP SMEAR-Modifier  12/05/2026   Hepatitis C Screening  Completed   HIV Screening  Completed   HPV VACCINES  Aged Out   INFLUENZA VACCINE  Discontinued   Immunization History  Administered Date(s) Administered   Td 07/31/2014   Tdap 10/07/2009, 03/24/2017   We updated and reviewed the patient's past history in detail and it is documented below. Allergies: Patient  reports no history of alcohol use. Past Medical History Patient  has a past medical history of Anemia, Anxiety, Arthritis, Complication of anesthesia, Depression, GERD (gastroesophageal reflux disease), Gestational diabetes, History of HELLP syndrome, currently pregnant, Hypertension, Intrinsic atopic dermatitis (09/03/2022), Pneumonia, PONV (postoperative nausea and vomiting), and Urticaria. Past Surgical History Patient  has a past surgical history that includes Hip surgery  (Left, 1997); Cesarean section (02/20/2008); Cesarean section with bilateral tubal ligation (N/A, 01/16/2020); and Total hip arthroplasty (Left, 03/14/2021). Social History   Socioeconomic History   Marital status: Married    Spouse name: Not on file   Number of children: Not on file   Years of education: Not on file   Highest education level: Not on file  Occupational History   Not on file  Tobacco Use   Smoking status: Former   Smokeless tobacco: Never  Vaping Use   Vaping Use: Never used  Substance and Sexual Activity   Alcohol use: No   Drug use: No   Sexual activity: Yes  Other Topics Concern   Not on file  Social History Narrative   Not on file   Social Determinants of Health   Financial Resource Strain: Not on file  Food Insecurity: Not on file  Transportation Needs: Not on file  Physical Activity: Not on file  Stress: Not on file  Social Connections: Not on file   Family History  Problem Relation Age of Onset   Frontotemporal dementia Mother        Primary Progressive Aphasia  ADD / ADHD Brother    Lupus Brother     Review of Systems: Constitutional: negative for fever or malaise Ophthalmic: negative for photophobia, double vision or loss of vision Cardiovascular: negative for chest pain, dyspnea on exertion, or new LE swelling Respiratory: negative for SOB or persistent cough Gastrointestinal: negative for abdominal pain, change in bowel habits or melena Genitourinary: negative for dysuria or gross hematuria, no abnormal uterine bleeding or disharge Musculoskeletal: negative for new gait disturbance or muscular weakness Integumentary: negative for new or persistent rashes, no breast lumps Neurological: negative for TIA or stroke symptoms Psychiatric: negative for SI or delusions Allergic/Immunologic: negative for hives  Patient Care Team    Relationship Specialty Notifications Start End  Willow Ora, MD PCP - General Family Medicine  07/26/20    Carlisle Cater, MD Consulting Physician Obstetrics and Gynecology  09/03/22     Objective  Vitals: BP 100/60   Pulse 92   Temp 98.1 F (36.7 C)   Ht 5\' 5"  (1.651 m)   Wt 179 lb 12.8 oz (81.6 kg)   SpO2 98%   BMI 29.92 kg/m  General:  Well developed, well nourished, no acute distress  Psych:  Alert and orientedx3,normal mood and affect HEENT:  Normocephalic, atraumatic, non-icteric sclera, PERRL, supple neck without adenopathy, mass or thyromegaly Cardiovascular:  Normal S1, S2, RRR without gallop, rub or murmur Respiratory:  Good breath sounds bilaterally, CTAB with normal respiratory effort Gastrointestinal: normal bowel sounds, soft, non-tender, no noted masses. No HSM MSK: no deformities, contusions. Joints are without erythema or swelling.  Skin:  Warm, no rashes or suspicious lesions noted Neurologic:    Mental status is normal. Gross motor and sensory exams are normal. Normal gait. No tremor    Commons side effects, risks, benefits, and alternatives for medications and treatment plan prescribed today were discussed, and the patient expressed understanding of the given instructions. Patient is instructed to call or message via MyChart if he/she has any questions or concerns regarding our treatment plan. No barriers to understanding were identified. We discussed Red Flag symptoms and signs in detail. Patient expressed understanding regarding what to do in case of urgent or emergency type symptoms.  Medication list was reconciled, printed and provided to the patient in AVS. Patient instructions and summary information was reviewed with the patient as documented in the AVS. This note was prepared with assistance of Dragon voice recognition software. Occasional wrong-word or sound-a-like substitutions may have occurred due to the inherent limitations of voice recognition software .

## 2022-09-08 ENCOUNTER — Other Ambulatory Visit (HOSPITAL_BASED_OUTPATIENT_CLINIC_OR_DEPARTMENT_OTHER): Payer: Self-pay

## 2022-09-17 ENCOUNTER — Encounter: Payer: Self-pay | Admitting: Family Medicine

## 2022-09-22 ENCOUNTER — Other Ambulatory Visit (HOSPITAL_BASED_OUTPATIENT_CLINIC_OR_DEPARTMENT_OTHER): Payer: Self-pay

## 2022-09-23 ENCOUNTER — Other Ambulatory Visit (HOSPITAL_BASED_OUTPATIENT_CLINIC_OR_DEPARTMENT_OTHER): Payer: Self-pay

## 2022-10-17 ENCOUNTER — Other Ambulatory Visit (HOSPITAL_BASED_OUTPATIENT_CLINIC_OR_DEPARTMENT_OTHER): Payer: Self-pay

## 2022-11-14 ENCOUNTER — Other Ambulatory Visit (HOSPITAL_BASED_OUTPATIENT_CLINIC_OR_DEPARTMENT_OTHER): Payer: Self-pay

## 2022-12-12 ENCOUNTER — Other Ambulatory Visit (HOSPITAL_BASED_OUTPATIENT_CLINIC_OR_DEPARTMENT_OTHER): Payer: Self-pay

## 2022-12-12 ENCOUNTER — Other Ambulatory Visit: Payer: Self-pay | Admitting: Family Medicine

## 2022-12-12 MED ORDER — CITALOPRAM HYDROBROMIDE 20 MG PO TABS
20.0000 mg | ORAL_TABLET | Freq: Every day | ORAL | 3 refills | Status: DC
  Filled 2022-12-12: qty 90, 90d supply, fill #0
  Filled 2023-03-17: qty 90, 90d supply, fill #1
  Filled 2023-09-22 – 2023-09-23 (×2): qty 90, 90d supply, fill #2

## 2022-12-22 ENCOUNTER — Other Ambulatory Visit (HOSPITAL_BASED_OUTPATIENT_CLINIC_OR_DEPARTMENT_OTHER): Payer: Self-pay

## 2023-01-14 IMAGING — DX DG PORTABLE PELVIS
1 series · 1 of 1 positions shown · non-contrast
Comparison: None.

CLINICAL DATA: Status post left total hip replacement.

EXAM:
PORTABLE PELVIS 1-2 VIEWS

[pelvis ap]
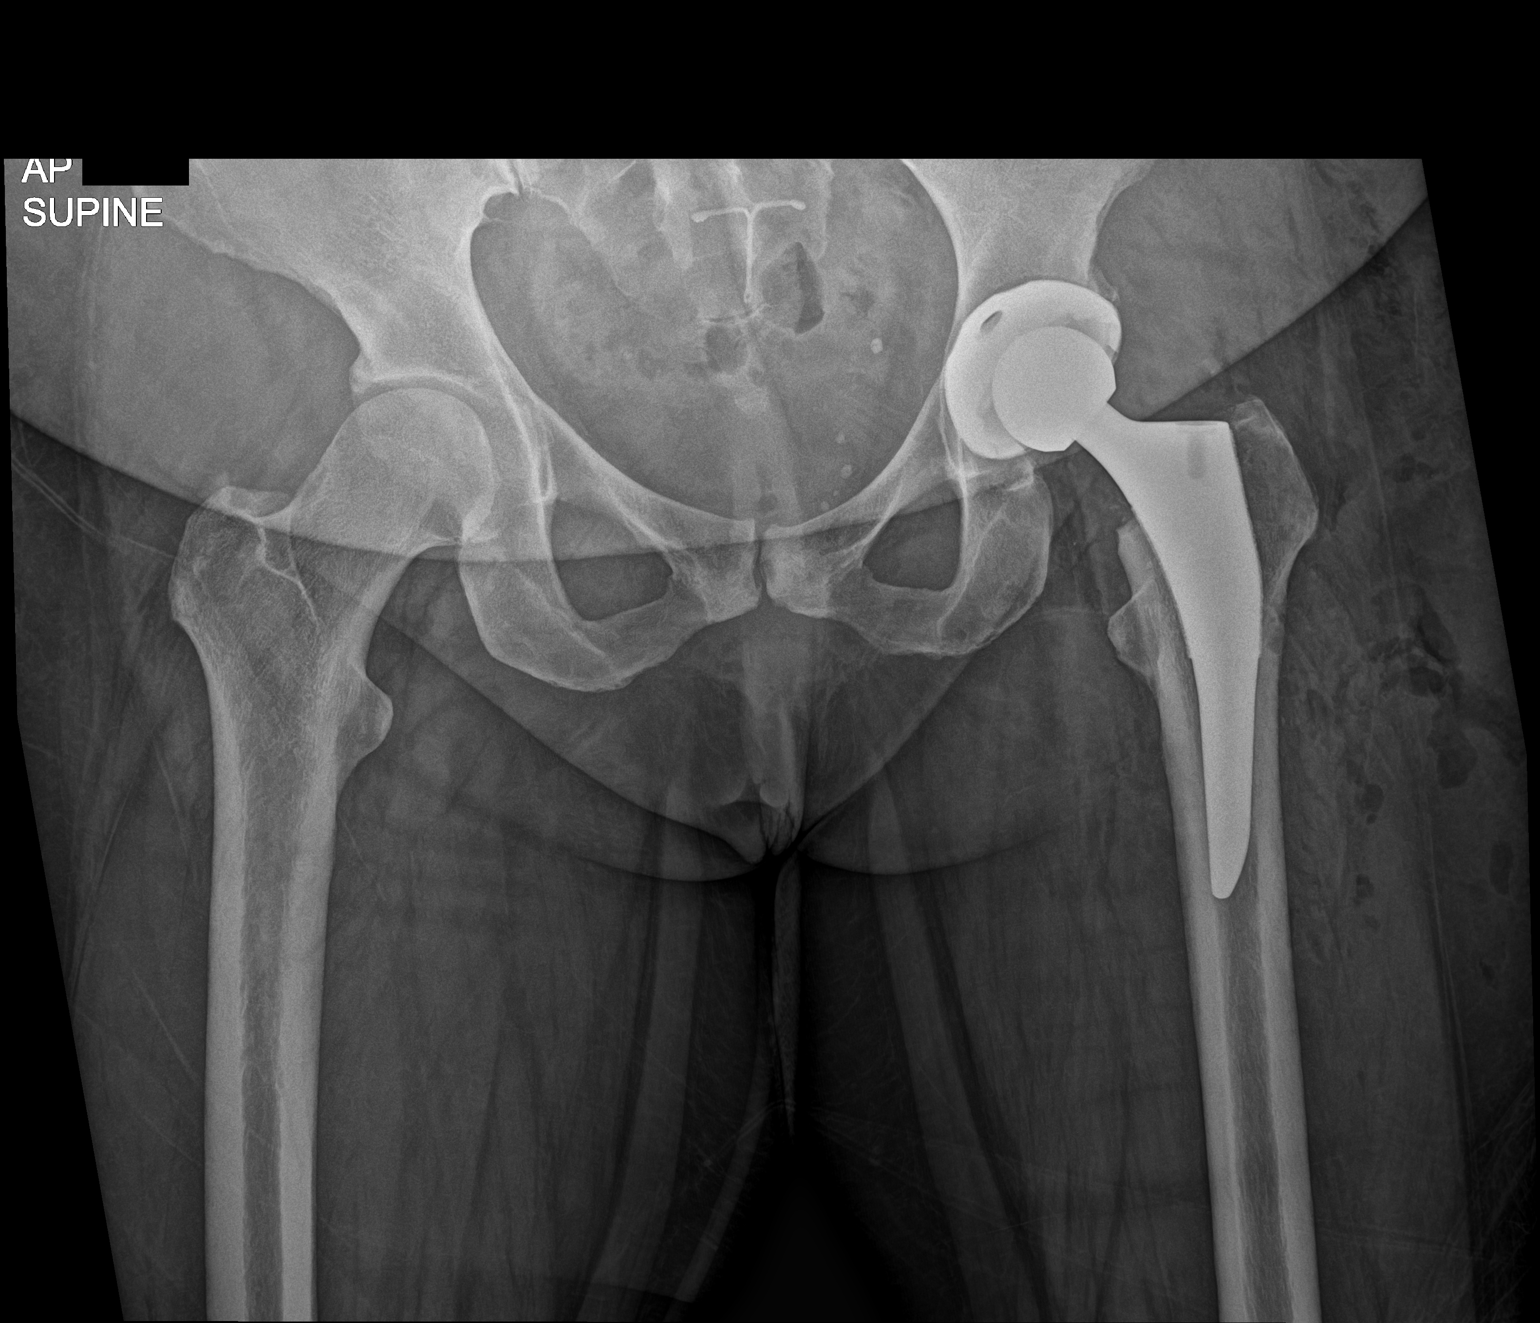

[1 of 1 positions shown; findings below may reference images not displayed]

FINDINGS: The left femoral and acetabular components appear to be well
situated. Expected postoperative changes are seen in the surrounding
soft tissues.
IMPRESSION: Status post left total hip arthroplasty.

## 2023-01-14 IMAGING — RF DG HIP (WITH PELVIS) OPERATIVE*L*
1 series · 4 of 4 positions shown · non-contrast
Comparison: None.

CLINICAL DATA: Status post total hip replacement

EXAM:
OPERATIVE LEFT HIP  1 VIEW
TECHNIQUE: Fluoroscopic spot image(s) were submitted for interpretation
post-operatively.

[Series 1: unknown protocol · 0.20mm/px · 4 of 4 slices shown]
[im 1/4]
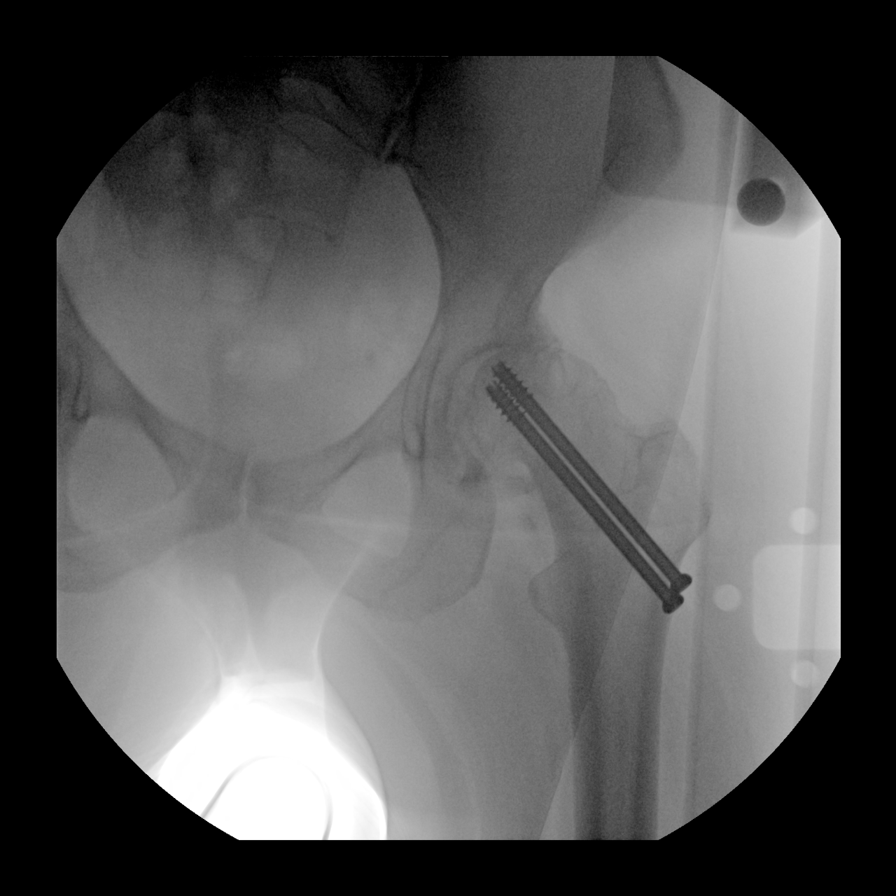
[im 2/4]
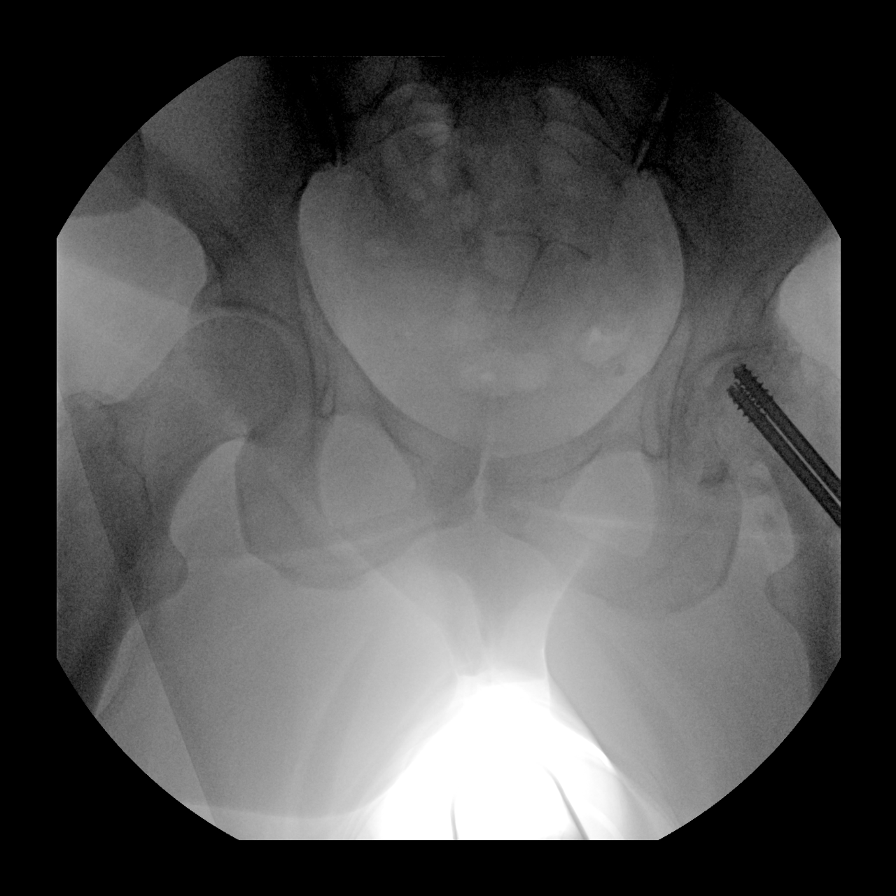
[im 3/4]
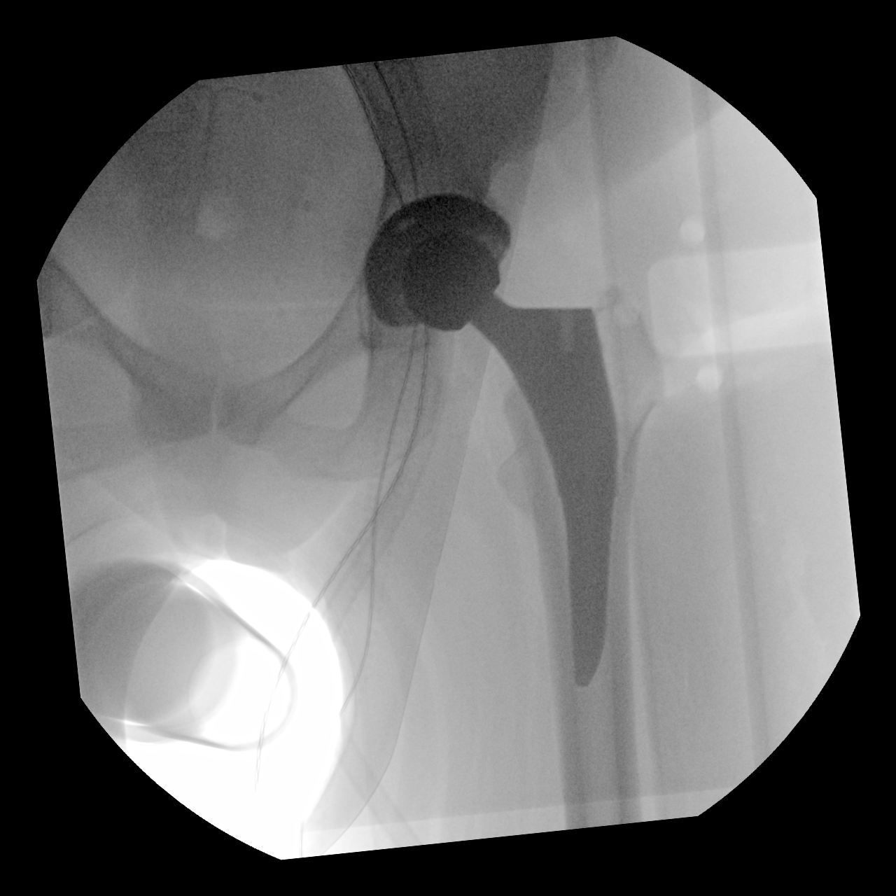
[im 4/4]
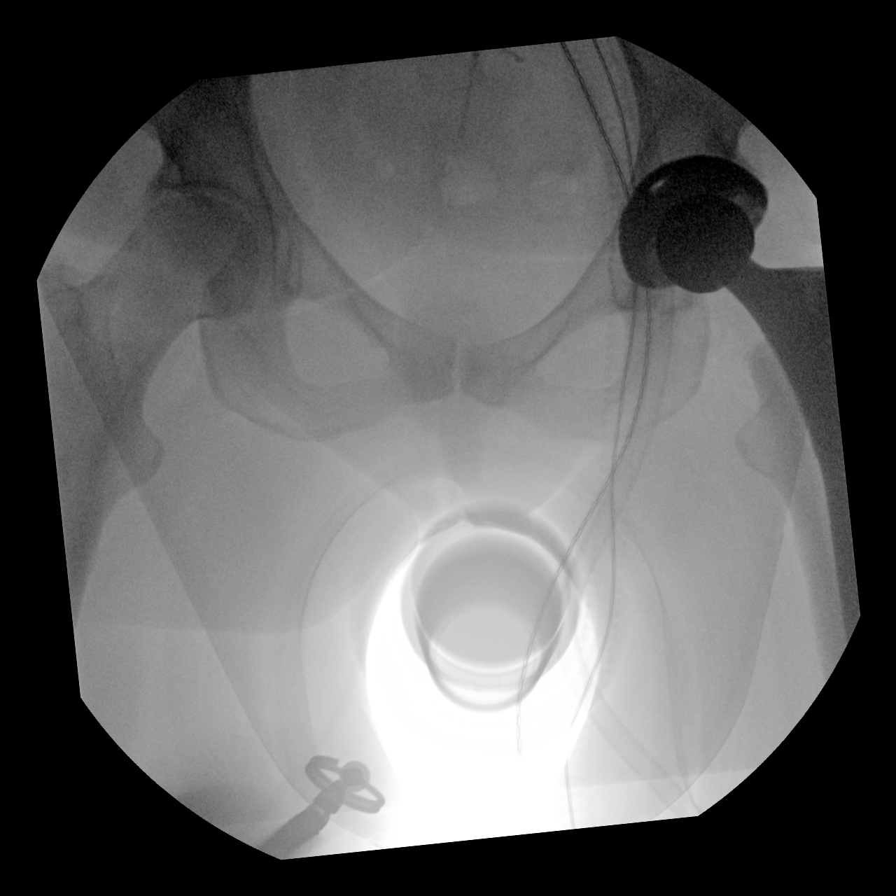

[4 of 4 positions shown; findings below may reference images not displayed]

FINDINGS: Frontal views obtained. Patient is status post total hip replacement
with prosthetic components well-seated on frontal view. No fracture
or dislocation. Right hip joint appears unremarkable. Intrauterine
device in mid pelvis.
IMPRESSION: Status post total hip replacement on the left with prosthetic
components well-seated on frontal view. No fracture or dislocation.
Normal appearing right hip joint on frontal view. Intrauterine
device in mid pelvis.

## 2023-01-28 ENCOUNTER — Other Ambulatory Visit (HOSPITAL_BASED_OUTPATIENT_CLINIC_OR_DEPARTMENT_OTHER): Payer: Self-pay

## 2023-02-06 ENCOUNTER — Other Ambulatory Visit (HOSPITAL_BASED_OUTPATIENT_CLINIC_OR_DEPARTMENT_OTHER): Payer: Self-pay

## 2023-02-06 ENCOUNTER — Encounter (HOSPITAL_BASED_OUTPATIENT_CLINIC_OR_DEPARTMENT_OTHER): Payer: Self-pay

## 2023-02-11 ENCOUNTER — Other Ambulatory Visit (HOSPITAL_BASED_OUTPATIENT_CLINIC_OR_DEPARTMENT_OTHER): Payer: Self-pay

## 2023-02-16 ENCOUNTER — Other Ambulatory Visit (HOSPITAL_BASED_OUTPATIENT_CLINIC_OR_DEPARTMENT_OTHER): Payer: Self-pay

## 2023-02-20 ENCOUNTER — Other Ambulatory Visit (HOSPITAL_BASED_OUTPATIENT_CLINIC_OR_DEPARTMENT_OTHER): Payer: Self-pay

## 2023-02-21 ENCOUNTER — Other Ambulatory Visit (HOSPITAL_BASED_OUTPATIENT_CLINIC_OR_DEPARTMENT_OTHER): Payer: Self-pay

## 2023-02-23 ENCOUNTER — Other Ambulatory Visit (HOSPITAL_BASED_OUTPATIENT_CLINIC_OR_DEPARTMENT_OTHER): Payer: Self-pay

## 2023-03-17 ENCOUNTER — Other Ambulatory Visit (HOSPITAL_BASED_OUTPATIENT_CLINIC_OR_DEPARTMENT_OTHER): Payer: Self-pay

## 2023-03-31 ENCOUNTER — Ambulatory Visit: Admitting: Family Medicine

## 2023-04-23 ENCOUNTER — Other Ambulatory Visit (HOSPITAL_BASED_OUTPATIENT_CLINIC_OR_DEPARTMENT_OTHER): Payer: Self-pay

## 2023-04-24 ENCOUNTER — Other Ambulatory Visit (HOSPITAL_BASED_OUTPATIENT_CLINIC_OR_DEPARTMENT_OTHER): Payer: Self-pay

## 2023-05-17 ENCOUNTER — Other Ambulatory Visit (HOSPITAL_BASED_OUTPATIENT_CLINIC_OR_DEPARTMENT_OTHER): Payer: Self-pay

## 2023-05-19 ENCOUNTER — Other Ambulatory Visit: Payer: Self-pay

## 2023-05-21 ENCOUNTER — Other Ambulatory Visit (HOSPITAL_BASED_OUTPATIENT_CLINIC_OR_DEPARTMENT_OTHER): Payer: Self-pay

## 2023-06-12 ENCOUNTER — Other Ambulatory Visit: Payer: Self-pay | Admitting: Family Medicine

## 2023-06-12 ENCOUNTER — Encounter: Payer: Self-pay | Admitting: Family Medicine

## 2023-06-12 MED ORDER — ZEPBOUND 15 MG/0.5ML ~~LOC~~ SOAJ: 15.0000 mg | SUBCUTANEOUS | 0 refills | Status: DC

## 2023-06-16 ENCOUNTER — Other Ambulatory Visit (HOSPITAL_BASED_OUTPATIENT_CLINIC_OR_DEPARTMENT_OTHER): Payer: Self-pay

## 2023-06-16 ENCOUNTER — Other Ambulatory Visit: Payer: Self-pay

## 2023-06-16 MED ORDER — TIRZEPATIDE 15 MG/0.5ML ~~LOC~~ SOAJ
15.0000 mg | SUBCUTANEOUS | 3 refills | Status: DC
  Filled 2023-06-16: qty 2, 28d supply, fill #0
  Filled 2023-07-17 – 2023-07-18 (×2): qty 2, 28d supply, fill #1
  Filled 2023-08-18: qty 2, 28d supply, fill #2

## 2023-07-18 ENCOUNTER — Other Ambulatory Visit (HOSPITAL_BASED_OUTPATIENT_CLINIC_OR_DEPARTMENT_OTHER): Payer: Self-pay

## 2023-07-20 ENCOUNTER — Other Ambulatory Visit (HOSPITAL_BASED_OUTPATIENT_CLINIC_OR_DEPARTMENT_OTHER): Payer: Self-pay

## 2023-08-19 ENCOUNTER — Other Ambulatory Visit (HOSPITAL_BASED_OUTPATIENT_CLINIC_OR_DEPARTMENT_OTHER): Payer: Self-pay

## 2023-09-07 ENCOUNTER — Ambulatory Visit (INDEPENDENT_AMBULATORY_CARE_PROVIDER_SITE_OTHER): Admitting: Family Medicine

## 2023-09-07 ENCOUNTER — Encounter: Payer: Self-pay | Admitting: Family Medicine

## 2023-09-07 ENCOUNTER — Other Ambulatory Visit (HOSPITAL_BASED_OUTPATIENT_CLINIC_OR_DEPARTMENT_OTHER): Payer: Self-pay

## 2023-09-07 VITALS — BP 110/80 | HR 83 | Temp 98.2°F | Ht 65.0 in | Wt 174.6 lb

## 2023-09-07 DIAGNOSIS — Z8679 Personal history of other diseases of the circulatory system: Secondary | ICD-10-CM

## 2023-09-07 DIAGNOSIS — Z975 Presence of (intrauterine) contraceptive device: Secondary | ICD-10-CM | POA: Diagnosis not present

## 2023-09-07 DIAGNOSIS — E538 Deficiency of other specified B group vitamins: Secondary | ICD-10-CM

## 2023-09-07 DIAGNOSIS — E66812 Obesity, class 2: Secondary | ICD-10-CM | POA: Diagnosis not present

## 2023-09-07 DIAGNOSIS — Z8632 Personal history of gestational diabetes: Secondary | ICD-10-CM

## 2023-09-07 DIAGNOSIS — D500 Iron deficiency anemia secondary to blood loss (chronic): Secondary | ICD-10-CM | POA: Diagnosis not present

## 2023-09-07 DIAGNOSIS — F3342 Major depressive disorder, recurrent, in full remission: Secondary | ICD-10-CM

## 2023-09-07 DIAGNOSIS — K219 Gastro-esophageal reflux disease without esophagitis: Secondary | ICD-10-CM

## 2023-09-07 DIAGNOSIS — Z6835 Body mass index (BMI) 35.0-35.9, adult: Secondary | ICD-10-CM

## 2023-09-07 DIAGNOSIS — Z0001 Encounter for general adult medical examination with abnormal findings: Secondary | ICD-10-CM

## 2023-09-07 LAB — COMPREHENSIVE METABOLIC PANEL
ALT: 10 U/L (ref 0–35)
AST: 13 U/L (ref 0–37)
Albumin: 3.8 g/dL (ref 3.5–5.2)
Alkaline Phosphatase: 32 U/L — ABNORMAL LOW (ref 39–117)
BUN: 18 mg/dL (ref 6–23)
CO2: 28 meq/L (ref 19–32)
Calcium: 8.7 mg/dL (ref 8.4–10.5)
Chloride: 104 meq/L (ref 96–112)
Creatinine, Ser: 0.81 mg/dL (ref 0.40–1.20)
GFR: 91.04 mL/min (ref >60.00)
Glucose, Bld: 70 mg/dL (ref 70–99)
Potassium: 3.8 meq/L (ref 3.5–5.1)
Sodium: 137 meq/L (ref 135–145)
Total Bilirubin: 0.7 mg/dL (ref 0.2–1.2)
Total Protein: 6.4 g/dL (ref 6.0–8.3)

## 2023-09-07 LAB — LIPID PANEL
Cholesterol: 178 mg/dL (ref 0–200)
HDL: 44.7 mg/dL (ref >39.00)
LDL Cholesterol: 110 mg/dL — ABNORMAL HIGH (ref 0–99)
NonHDL: 133.36
Total CHOL/HDL Ratio: 4
Triglycerides: 118 mg/dL (ref 0.0–149.0)
VLDL: 23.6 mg/dL (ref 0.0–40.0)

## 2023-09-07 LAB — TSH: TSH: 1.65 u[IU]/mL (ref 0.35–5.50)

## 2023-09-07 LAB — CBC WITH DIFFERENTIAL/PLATELET
Basophils Absolute: 0 10*3/uL (ref 0.0–0.1)
Basophils Relative: 0.4 % (ref 0.0–3.0)
Eosinophils Absolute: 0.1 10*3/uL (ref 0.0–0.7)
Eosinophils Relative: 1.5 % (ref 0.0–5.0)
HCT: 43 % (ref 36.0–46.0)
Hemoglobin: 14.3 g/dL (ref 12.0–15.0)
Lymphocytes Relative: 28.1 % (ref 12.0–46.0)
Lymphs Abs: 1.7 10*3/uL (ref 0.7–4.0)
MCHC: 33.3 g/dL (ref 30.0–36.0)
MCV: 85.9 fL (ref 78.0–100.0)
Monocytes Absolute: 0.3 10*3/uL (ref 0.1–1.0)
Monocytes Relative: 5.4 % (ref 3.0–12.0)
Neutro Abs: 3.9 10*3/uL (ref 1.4–7.7)
Neutrophils Relative %: 64.6 % (ref 43.0–77.0)
Platelets: 247 10*3/uL (ref 150.0–400.0)
RBC: 5 Mil/uL (ref 3.87–5.11)
RDW: 13.6 % (ref 11.5–15.5)
WBC: 6 10*3/uL (ref 4.0–10.5)

## 2023-09-07 LAB — VITAMIN B12: Vitamin B-12: 743 pg/mL (ref 211–911)

## 2023-09-07 LAB — HEMOGLOBIN A1C: Hgb A1c MFr Bld: 5.1 % (ref 4.6–6.5)

## 2023-09-07 MED ORDER — TIRZEPATIDE 10 MG/0.5ML ~~LOC~~ SOAJ
10.0000 mg | SUBCUTANEOUS | 3 refills | Status: DC
  Filled 2023-09-07 – 2023-09-23 (×3): qty 2, 28d supply, fill #0
  Filled 2023-10-21 – 2023-10-30 (×2): qty 2, 28d supply, fill #1

## 2023-09-07 NOTE — Patient Instructions (Addendum)
Please return in 12 months for your annual complete physical; please come fasting.   I will release your lab results to you on your MyChart account with further instructions. You may see the results before I do, but when I review them I will send you a message with my report or have my assistant call you if things need to be discussed. Please reply to my message with any questions. Thank you!   If you have any questions or concerns, please don't hesitate to send me a message via MyChart or call the office at [phone]. Thank you for visiting with [place] today! It's our pleasure caring for you.   VISIT SUMMARY:  During today's visit, we discussed your increased irritability and short temper, which may be related to perimenopause. Your mood has been stable on Celexa, and your GERD is well-managed. We also reviewed your weight management plan with [facility] and general health maintenance, including routine screenings.  YOUR PLAN:  -PERIMENOPAUSAL SYMPTOMS: Perimenopause is the transition period before menopause when hormonal changes can cause symptoms like mood swings. We discussed behavioral strategies to manage irritability and recommended continuing your current dose of Celexa.   -OBESITY: Obesity is a condition characterized by excessive body fat. You are currently managing your weight well with Mounjaro, and we recommend reducing the dose to 10 mg for maintenance. We will also monitor your lab work to ensure stability.  -GASTROESOPHAGEAL REFLUX DISEASE (GERD): GERD is a chronic condition where stomach acid frequently flows back into the esophagus, causing irritation. Your GERD is well-controlled, so we will continue with the current management plan.  -GENERAL HEALTH MAINTENANCE: It is important to stay up-to-date with routine health screenings and preventive measures. You are current with your Pap smears, and we discussed mammogram guidelines. Please discuss mammogram screening with your OB-GYN. We  will also obtain blood work to monitor your overall health.  INSTRUCTIONS:  Please schedule your annual follow-up visit and call us if any issues arise. Additionally, discuss your perimenopausal symptoms, Pap smear, and mammogram screening with your OB-GYN.

## 2023-09-07 NOTE — Progress Notes (Signed)
Subjective  Chief Complaint  Patient presents with   Annual Exam    Pt here for Annual Exam and is currently not fasting. Debra Giles has not been scheduled    Hypertension   Obesity    HPI: Debra Giles is a 42 y.o. female who presents to [facility] [place] today for a Female Wellness Visit.  She also has the concerns and/or needs as listed above in the chief complaint. These will be addressed in addition to the Health Maintenance Visit.   Wellness Visit: annual visit with health maintenance review and exam HM: sees gyn. Pap last done 2 years ago. Will call for records. No mammo yet. No significant risk factors for breast cancer. Feels well. Eating healthy diet and exercising.  Chronic disease management visit and/or acute problem visit: Discussed the use of AI scribe software for clinical note transcription with the patient, who gave verbal consent to proceed.  History of Present Illness   The patient, a 42 year old individual with a history of depression, anxiety, and GERD, presents with concerns of increased irritability and short temper, which she attributes to possible perimenopause. She reports that her mood has been stable on her current regimen of Celexa, and she denies any changes in sleep patterns or symptoms of depression and anxiety. However, she expresses a new onset of 'rage' and short temper, particularly towards minor inconveniences.  The patient also reports stable weight and good adherence to diet and exercise. She is currently on a medication called Mounjaro, which she tolerates well. She has previously taken psyllium husk for constipation and magnesium, but has discontinued these due to lack of need and suspected side effects, respectively.  Her GERD is reportedly well-managed, and she denies any new symptoms. She also reports stable blood pressure, which she notes has improved since weight loss. She has a history of regular Pap smears and believes she  is up-to-date, but will confirm with her OB-GYN. She has not yet started mammograms and will discuss this with her OB-GYN as well.      Assessment  1. Encounter for well adult exam with abnormal findings   2. Recurrent major depressive disorder, in full remission (HCC)   3. History of hypertension   4. Gastroesophageal reflux disease without esophagitis   5. B12 deficiency   6. Class 2 severe obesity due to excess calories with serious comorbidity and body mass index (BMI) of 35.0 to 35.9 in adult (HCC)   7. History of gestational diabetes   8. Iron deficiency anemia due to chronic blood loss   9. IUD contraception      Plan  Female Wellness Visit: Age appropriate Health Maintenance and Prevention measures were discussed with patient. Included topics are cancer screening recommendations, ways to keep healthy (see AVS) including dietary and exercise recommendations, regular eye and dental care, use of seat belts, and avoidance of moderate alcohol use and tobacco use. Screens are current BMI: discussed patient's BMI and encouraged positive lifestyle modifications to help get to or maintain a target BMI. HM needs and immunizations were addressed and ordered. See below for orders. See HM and immunization section for updates. Routine labs and screening tests ordered including cmp, cbc and lipids where appropriate. Discussed recommendations regarding Vit D and calcium supplementation (see AVS)  Chronic disease f/u and/or acute problem visit: (deemed necessary to be done in addition to the wellness visit): Assessment and Plan    Perimenopausal Symptoms and chronic anxiety. Mostly well controlled.  Increased  irritability and short temper, potentially related to perimenopause. Currently on Celexa, effective for depression and anxiety but may need adjustment for mood swings. Discussed behavioral strategies such as taking a minute, breathing, and counting. - Discuss with OB regarding perimenopausal  symptoms and potential need for Pap smear - Continue current dose of Celexa 20 daily - Consider behavioral strategies  Obesity On Mounjaro for weight management, reports weight maintenance.  Bmi remains 29. Tolerating medication well and maintaining good diet and exercise regimen. Discussed reducing dose for maintenance and monitoring lab work for stability. - Reduce Mounjaro dose to 10 mg for maintenance - Check lab work for stability, monitor lipids and hgba1c  Gastroesophageal Reflux Disease (GERD) GERD is well-controlled. - Continue current management for GERD, monitor b12 levels on oral supplements.  General Health Maintenance Up to date with Pap smears, no family history of breast cancer. Discussed mammogram guidelines and preference to defer. Emphasized importance of routine screenings and preventive health measures. - Discuss mammogram screening with OB - Obtain blood work  Follow-up - Schedule annual follow-up visit - Call if any issues arise.     Follow up: 12 mo for cpe  Orders Placed This Encounter  Procedures   CBC with Differential/Platelet   Comprehensive metabolic panel   Lipid panel   Hemoglobin A1c   TSH   Vitamin B12   HM PAP SMEAR   Meds ordered this encounter  Medications   tirzepatide (MOUNJARO) 10 MG/0.5ML Pen    Sig: Inject 10 mg into the skin once a week.    Dispense:  6 mL    Refill:  3     Body mass index is 29.05 kg/m. Wt Readings from Last 3 Encounters:  09/07/23 174 lb 9.6 oz (79.2 kg)  09/03/22 179 lb 12.8 oz (81.6 kg)  02/04/22 198 lb 3.2 oz (89.9 kg)    Patient Active Problem List   Diagnosis Date Noted Date Diagnosed   Intrinsic atopic dermatitis 09/03/2022    B12 deficiency 02/04/2022    History of total hip arthroplasty, left 08/06/2021     2022    Chronic left shoulder pain 03/14/2020    History of gestational diabetes 03/14/2020    Iron deficiency anemia due to chronic blood loss 09/25/2017     Resolved with oral  supplements and IUD    History of hypertension 06/06/2016     Resolved with weight loss 2023; no meds now Dxd 2015; well controlled. Strong FH on HTN    Gastroesophageal reflux disease without esophagitis 06/06/2016    Class 2 severe obesity due to excess calories with serious comorbidity and body mass index (BMI) of 35.0 to 35.9 in adult [facility]) 06/06/2016     Formatting of this note might be different from the original. IMO 2019 R1.0 Update    Recurrent major depressive disorder, in full remission (HCC) 06/06/2016     On chronic celexa    IUD contraception 08/23/2012    Health Maintenance  Topic Date Due   COVID-19 Vaccine (1 - 2023-24 season) 09/23/2023 (Originally 06/07/2023)   Cervical Cancer Screening (HPV/Pap Cotest)  01/01/2027   DTaP/Tdap/Td (4 - Td or Tdap) 03/25/2027   Hepatitis C Screening  Completed   HIV Screening  Completed   HPV VACCINES  Aged Out   INFLUENZA VACCINE  Discontinued   Immunization History  Administered Date(s) Administered   Td 07/31/2014   Tdap 10/07/2009, 03/24/2017   We updated and reviewed the patient's past history in detail and it is documented  below. Allergies: Patient  reports no history of alcohol use. Past Medical History Patient  has a past medical history of Anemia, Anxiety, Arthritis, Complication of anesthesia, Depression, GERD (gastroesophageal reflux disease), Gestational diabetes, History of HELLP syndrome, currently pregnant, Hypertension, Intrinsic atopic dermatitis (09/03/2022), Pneumonia, PONV (postoperative nausea and vomiting), and Urticaria. Past Surgical History Patient  has a past surgical history that includes Hip surgery (Left, 1997); Cesarean section (02/20/2008); Cesarean section with bilateral tubal ligation (N/A, 01/16/2020); and Total hip arthroplasty (Left, 03/14/2021). Social History   Socioeconomic History   Marital status: Married    Spouse name: Not on file   Number of children: Not on file   Years of education:  Not on file   Highest education level: Not on file  Occupational History   Not on file  Tobacco Use   Smoking status: Former   Smokeless tobacco: Never  Vaping Use   Vaping status: Never Used  Substance and Sexual Activity   Alcohol use: No   Drug use: No   Sexual activity: Yes  Other Topics Concern   Not on file  Social History Narrative   Not on file   Social Determinants of Health   Financial Resource Strain: Not on file  Food Insecurity: Not on file  Transportation Needs: Not on file  Physical Activity: Not on file  Stress: Not on file  Social Connections: Unknown (02/04/2022)   Received from [facility]   Social Network    Social Network: Not on file   Family History  Problem Relation Age of Onset   Frontotemporal dementia Mother        Primary Progressive Aphasia    ADD / ADHD Brother    Lupus Brother     Review of Systems: Constitutional: negative for fever or malaise Ophthalmic: negative for photophobia, double vision or loss of vision Cardiovascular: negative for chest pain, dyspnea on exertion, or new LE swelling Respiratory: negative for SOB or persistent cough Gastrointestinal: negative for abdominal pain, change in bowel habits or melena Genitourinary: negative for dysuria or gross hematuria, no abnormal uterine bleeding or disharge Musculoskeletal: negative for new gait disturbance or muscular weakness Integumentary: negative for new or persistent rashes, no breast lumps Neurological: negative for TIA or stroke symptoms Psychiatric: negative for SI or delusions Allergic/Immunologic: negative for hives  Patient Care Team    Relationship Specialty Notifications Start End  Willow Ora, MD PCP - General Family Medicine  07/26/20   Carlisle Cater, MD Consulting Physician Obstetrics and Gynecology  09/03/22     Objective  Vitals: BP 110/80   Pulse 83   Temp 98.2 F (36.8 C)   Ht 5\' 5"  (1.651 m)   Wt 174 lb 9.6 oz (79.2 kg)    SpO2 98%   BMI 29.05 kg/m  General:  Well developed, well nourished, no acute distress  Psych:  Alert and orientedx3,normal mood and affect HEENT:  Normocephalic, atraumatic, non-icteric sclera, PERRL, supple neck without adenopathy, mass or thyromegaly Cardiovascular:  Normal S1, S2, RRR without gallop, rub or murmur Respiratory:  Good breath sounds bilaterally, CTAB with normal respiratory effort Gastrointestinal: normal bowel sounds, soft, non-tender, no noted masses. No HSM MSK: no deformities, contusions. Joints are without erythema or swelling.  Skin:  Warm, no rashes or suspicious lesions noted Neurologic:    Mental status is normal. Gross motor and sensory exams are normal. Normal gait. No tremor   Commons side effects, risks, benefits, and alternatives for medications and treatment  plan prescribed today were discussed, and the patient expressed understanding of the given instructions. Patient is instructed to call or message via MyChart if he/she has any questions or concerns regarding our treatment plan. No barriers to understanding were identified. We discussed Red Flag symptoms and signs in detail. Patient expressed understanding regarding what to do in case of urgent or emergency type symptoms.  Medication list was reconciled, printed and provided to the patient in AVS. Patient instructions and summary information was reviewed with the patient as documented in the AVS. This note was prepared with assistance of Dragon voice recognition software. Occasional wrong-word or sound-a-like substitutions may have occurred due to the inherent limitations of voice recognition software .

## 2023-09-08 NOTE — Progress Notes (Signed)
Labs reviewed.  The 10-year ASCVD risk score (Arnett DK, et al., 2019) is: 0.7%   Values used to calculate the score:     Age: 42 years     Sex: Female     Is Non-Hispanic African American: No     Diabetic: No     Tobacco smoker: No     Systolic Blood Pressure: 110 mmHg     Is BP treated: Yes     HDL Cholesterol: 44.7 mg/dL     Total Cholesterol: 178 mg/dL

## 2023-09-17 ENCOUNTER — Other Ambulatory Visit (HOSPITAL_BASED_OUTPATIENT_CLINIC_OR_DEPARTMENT_OTHER): Payer: Self-pay

## 2023-09-22 ENCOUNTER — Other Ambulatory Visit (HOSPITAL_BASED_OUTPATIENT_CLINIC_OR_DEPARTMENT_OTHER): Payer: Self-pay

## 2023-09-22 ENCOUNTER — Other Ambulatory Visit: Payer: Self-pay

## 2023-09-23 ENCOUNTER — Other Ambulatory Visit (HOSPITAL_BASED_OUTPATIENT_CLINIC_OR_DEPARTMENT_OTHER): Payer: Self-pay

## 2023-09-24 ENCOUNTER — Other Ambulatory Visit (HOSPITAL_COMMUNITY): Payer: Self-pay

## 2023-10-21 ENCOUNTER — Other Ambulatory Visit (HOSPITAL_BASED_OUTPATIENT_CLINIC_OR_DEPARTMENT_OTHER): Payer: Self-pay

## 2023-10-22 ENCOUNTER — Other Ambulatory Visit (HOSPITAL_BASED_OUTPATIENT_CLINIC_OR_DEPARTMENT_OTHER): Payer: Self-pay

## 2023-10-26 ENCOUNTER — Other Ambulatory Visit (HOSPITAL_BASED_OUTPATIENT_CLINIC_OR_DEPARTMENT_OTHER): Payer: Self-pay

## 2023-10-30 ENCOUNTER — Other Ambulatory Visit (HOSPITAL_BASED_OUTPATIENT_CLINIC_OR_DEPARTMENT_OTHER): Payer: Self-pay

## 2023-10-30 ENCOUNTER — Other Ambulatory Visit: Payer: Self-pay | Admitting: Family Medicine

## 2023-10-31 ENCOUNTER — Other Ambulatory Visit (HOSPITAL_BASED_OUTPATIENT_CLINIC_OR_DEPARTMENT_OTHER): Payer: Self-pay

## 2023-10-31 MED ORDER — MOUNJARO 10 MG/0.5ML ~~LOC~~ SOAJ
10.0000 mg | SUBCUTANEOUS | 3 refills | Status: DC
  Filled 2023-10-31: qty 6, 84d supply, fill #0

## 2023-11-02 ENCOUNTER — Other Ambulatory Visit: Payer: Self-pay

## 2023-11-02 ENCOUNTER — Other Ambulatory Visit (HOSPITAL_BASED_OUTPATIENT_CLINIC_OR_DEPARTMENT_OTHER): Payer: Self-pay

## 2023-11-02 ENCOUNTER — Encounter: Payer: Self-pay | Admitting: Family Medicine

## 2023-11-02 DIAGNOSIS — E66812 Obesity, class 2: Secondary | ICD-10-CM

## 2023-11-02 MED ORDER — MOUNJARO 10 MG/0.5ML ~~LOC~~ SOAJ
10.0000 mg | SUBCUTANEOUS | 3 refills | Status: DC
  Filled 2023-11-02: qty 2, 28d supply, fill #0

## 2023-11-04 ENCOUNTER — Other Ambulatory Visit (HOSPITAL_BASED_OUTPATIENT_CLINIC_OR_DEPARTMENT_OTHER): Payer: Self-pay

## 2023-11-04 ENCOUNTER — Other Ambulatory Visit: Payer: Self-pay | Admitting: Family Medicine

## 2023-11-04 ENCOUNTER — Encounter (HOSPITAL_BASED_OUTPATIENT_CLINIC_OR_DEPARTMENT_OTHER): Payer: Self-pay

## 2023-11-04 MED ORDER — ZEPBOUND 10 MG/0.5ML ~~LOC~~ SOAJ
10.0000 mg | SUBCUTANEOUS | 5 refills | Status: DC
  Filled 2023-11-04: qty 2, 28d supply, fill #0

## 2023-11-05 ENCOUNTER — Other Ambulatory Visit (HOSPITAL_BASED_OUTPATIENT_CLINIC_OR_DEPARTMENT_OTHER): Payer: Self-pay

## 2023-11-06 ENCOUNTER — Other Ambulatory Visit (HOSPITAL_BASED_OUTPATIENT_CLINIC_OR_DEPARTMENT_OTHER): Payer: Self-pay

## 2023-11-27 ENCOUNTER — Ambulatory Visit: Admitting: Family Medicine

## 2023-12-30 ENCOUNTER — Ambulatory Visit: Admitting: Family Medicine

## 2024-01-06 ENCOUNTER — Encounter: Payer: Self-pay | Admitting: Family Medicine

## 2024-01-06 ENCOUNTER — Ambulatory Visit: Admitting: Family Medicine

## 2024-01-08 MED ORDER — TIRZEPATIDE-WEIGHT MANAGEMENT 2.5 MG/0.5ML ~~LOC~~ SOLN: 2.5000 mg | SUBCUTANEOUS | 0 refills | Status: DC

## 2024-01-26 ENCOUNTER — Other Ambulatory Visit: Payer: Self-pay | Admitting: Family Medicine

## 2024-01-26 ENCOUNTER — Other Ambulatory Visit (HOSPITAL_BASED_OUTPATIENT_CLINIC_OR_DEPARTMENT_OTHER): Payer: Self-pay

## 2024-01-26 ENCOUNTER — Other Ambulatory Visit: Payer: Self-pay

## 2024-01-26 MED ORDER — CITALOPRAM HYDROBROMIDE 20 MG PO TABS
20.0000 mg | ORAL_TABLET | Freq: Every day | ORAL | 3 refills | Status: DC
  Filled 2024-01-26: qty 90, 90d supply, fill #0

## 2024-01-27 ENCOUNTER — Other Ambulatory Visit (HOSPITAL_BASED_OUTPATIENT_CLINIC_OR_DEPARTMENT_OTHER): Payer: Self-pay

## 2024-01-29 MED ORDER — TIRZEPATIDE-WEIGHT MANAGEMENT 5 MG/0.5ML ~~LOC~~ SOLN: 5.0000 mg | SUBCUTANEOUS | 2 refills | Status: DC

## 2024-01-29 NOTE — Addendum Note (Signed)
 Addended by: Karma Oz on: 01/29/2024 12:08 PM   Modules accepted: Orders

## 2024-02-23 MED ORDER — TIRZEPATIDE 7.5 MG/0.5ML ~~LOC~~ SOAJ: 7.5000 mg | SUBCUTANEOUS | 2 refills | Status: DC

## 2024-02-23 NOTE — Addendum Note (Signed)
 Addended by: Karma Oz on: 02/23/2024 04:41 PM   Modules accepted: Orders

## 2024-03-03 MED ORDER — ZEPBOUND 7.5 MG/0.5ML ~~LOC~~ SOLN: 7.5000 mg | SUBCUTANEOUS | 5 refills | Status: DC

## 2024-03-03 NOTE — Addendum Note (Signed)
 Addended by: Karma Oz on: 03/03/2024 08:12 AM   Modules accepted: Orders

## 2024-03-09 ENCOUNTER — Ambulatory Visit (INDEPENDENT_AMBULATORY_CARE_PROVIDER_SITE_OTHER): Admitting: Family Medicine

## 2024-03-09 ENCOUNTER — Encounter (INDEPENDENT_AMBULATORY_CARE_PROVIDER_SITE_OTHER): Admitting: Family Medicine

## 2024-03-09 ENCOUNTER — Encounter: Payer: Self-pay | Admitting: Family Medicine

## 2024-03-09 VITALS — BP 116/80 | HR 88 | Temp 98.2°F | Ht 65.0 in | Wt 185.4 lb

## 2024-03-09 DIAGNOSIS — Z8679 Personal history of other diseases of the circulatory system: Secondary | ICD-10-CM | POA: Diagnosis not present

## 2024-03-09 DIAGNOSIS — F3342 Major depressive disorder, recurrent, in full remission: Secondary | ICD-10-CM | POA: Diagnosis not present

## 2024-03-09 DIAGNOSIS — Z6835 Body mass index (BMI) 35.0-35.9, adult: Secondary | ICD-10-CM

## 2024-03-09 DIAGNOSIS — L748 Other eccrine sweat disorders: Secondary | ICD-10-CM | POA: Diagnosis not present

## 2024-03-09 DIAGNOSIS — E66812 Obesity, class 2: Secondary | ICD-10-CM | POA: Diagnosis not present

## 2024-03-09 DIAGNOSIS — L923 Foreign body granuloma of the skin and subcutaneous tissue: Secondary | ICD-10-CM | POA: Diagnosis not present

## 2024-03-09 MED ORDER — NALTREXONE-BUPROPION HCL ER 8-90 MG PO TB12: ORAL_TABLET | ORAL | 5 refills | Status: DC

## 2024-03-09 MED ORDER — CITALOPRAM HYDROBROMIDE 10 MG PO TABS: 10.0000 mg | ORAL_TABLET | Freq: Every day | ORAL | 3 refills | Status: DC

## 2024-03-09 NOTE — Progress Notes (Signed)
 Subjective  CC:  Chief Complaint  Patient presents with   Obesity   Hypertension   Tattoo piercing     Pt stated that she got a tattoo on her Rt arm last Monday and it looks  like it may be infected and wanted to have it checked out     HPI: Debra Giles is a 43 y.o. female who presents to the office today to address the problems listed above in the chief complaint. Discussed the use of AI scribe software for clinical note transcription with the patient, who gave verbal consent to proceed.  History of Present Illness Debra Giles is a 43 year old female who presents for follow-up on Zepbound  treatment.  Obesity follow-up: Her weight is stable but slightly higher than in December. Initially, she had good results with Zepbound  but had to stop in December due to insurance issues, leading to weight regain. During the three-month period off the medication, she experienced increased appetite and cravings, describing it as 'bad' and feeling 'hungry all the time.' She eventually resumed Zepbound  by paying out of pocket and is currently on a 7.5 mg dose since Sunday. Previously, she was on a higher dose of 12.5 or 15 mg, which she felt was not as effective as initially.  She is trying to maintain a calorie deficit and has changed her eating habits significantly since starting Zepbound . However, she struggles to meet her daily protein goal of 120-150 mg, which she finds challenging. She engages in regular physical activity, including swimming daily, walking at least a mile, and participating in CrossFit. However, she has temporarily halted these activities due to issues with a new tattoo.  She is currently on Celexa  20 mg for mood management. She has a history of postpartum anxiety, for which she was previously on a higher dose of Celexa . She feels her mood is well-managed at the current dose.  She mentions feeling frustrated with her weight loss journey, especially with the metabolic  changes she has noticed since turning 42. She reports good sleep quality, aided by her four-year-old's sleep schedule, and feels positive about her overall lifestyle changes despite not seeing the desired results.  She received a tattoo on her right arm last week.  Upper area is slightly more red and tender.  No drainage.  Wt Readings from Last 3 Encounters:  03/09/24 185 lb 6.4 oz (84.1 kg)  09/07/23 174 lb 9.6 oz (79.2 kg)  09/03/22 179 lb 12.8 oz (81.6 kg)   BP Readings from Last 3 Encounters:  03/09/24 116/80  09/07/23 110/80  09/03/22 100/60    Assessment  1. Class 2 severe obesity due to excess calories with serious comorbidity and body mass index (BMI) of 35.0 to 35.9 in adult [facility])   2. History of hypertension   3. Recurrent major depressive disorder, in full remission (HCC)   4. Tattoo reaction      Plan  Assessment and Plan Assessment & Plan Obesity Weight management with Zepbound  is limited by insurance issues, leading to weight regain. Current dose maintains weight but not loss. Discussed potential addition of Contrave for cravings, though insurance coverage is uncertain. Lifestyle modifications ongoing, but protein intake and Celexa  effects are challenges. Reducing Celexa  may aid weight management. - Continue Zepbound  at 7.5 mg, consider titrating to 10 mg. - start contrave titrate up to 2 tab bid.  Discussed mechanisms of action and how this could be an adjuvant to Zepbound . - Reduce Celexa  from 20 mg to  10 mg, monitor mood over 6-12 weeks. - Encourage increased protein intake. - Continue exercise regimen, ensure adequate sleep.  Tattoo Reaction Allergic reaction to red ink, not infection. Allergic to cortisone, limiting treatment options. Recommended Benadryl  and Zyrtec  for management. - Apply Vaseline to affected area. - Take oral Zyrtec . - Consider topical Benadryl  if needed. - Monitor for infection, avoid antibiotics.  Blood pressure looks good General  Health Maintenance Blood pressure well-controlled. Maintaining healthy lifestyle with exercise and sleep.  Follow-up Monitor effects of medication changes and weight management progress. - Schedule follow-up in 6-12 weeks to assess mood and weight management. - Adjust Zepbound  dosage based on progress and potential Contrave addition.  Follow up: 8 to 12 weeks to recheck weight and mood No orders of the defined types were placed in this encounter.  Meds ordered this encounter  Medications   citalopram  (CELEXA ) 10 MG tablet    Sig: Take 1 tablet (10 mg total) by mouth daily.    Dispense:  90 tablet    Refill:  3    Decreasing dose down from 20mg    Naltrexone-buPROPion HCl ER 8-90 MG TB12    Sig: Start 1 tablet every morning for 7 days, then 1 tablet twice daily for 7 days, then 2 tablets every morning and one in the evening x 7 days, then 2 tab twice a day    Dispense:  120 tablet    Refill:  5     I reviewed the patients updated PMH, FH, and SocHx.  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   Iron-Vit C-Vit B12-Folic  Acid (IRON 100 PLUS) 100-250-0.025-1 MG TABS Take 195 mg by mouth daily.   Naltrexone-buPROPion HCl ER 8-90 MG TB12 Start 1 tablet every morning for 7 days, then 1 tablet twice daily for 7 days, then 2 tablets every morning and one in the evening x 7 days, then 2 tab twice a day   omeprazole  (PRILOSEC) 20 MG capsule Take 20 mg by mouth in the morning and at bedtime.   tirzepatide  (ZEPBOUND ) 7.5 MG/0.5ML injection vial Inject 7.5 mg into the skin once a week.   [DISCONTINUED] citalopram  (CELEXA ) 20 MG tablet Take 1 tablet (20 mg total) by mouth daily.   Allergies: Patient is allergic to percocet [oxycodone -acetaminophen ] and hydrocortisone. Family History: Patient family history includes ADD / ADHD in her brother; Frontotemporal dementia in her mother; Lupus in her brother. Social History:  Patient  reports that she has quit smoking. She has never used smokeless tobacco. She  reports that she does not drink alcohol  and does not use drugs.  Review of Systems: Constitutional: Negative for fever malaise or anorexia Cardiovascular: negative for chest pain Respiratory: negative for SOB or persistent cough Gastrointestinal: negative for abdominal pain  Objective  Vitals: BP 116/80   Pulse 88   Temp 98.2 F (36.8 C)   Ht 5\' 5"  (1.651 m)   Wt 185 lb 6.4 oz (84.1 kg)   SpO2 98%   BMI 30.85 kg/m  General: no acute distress , A&Ox3 Skin:  Warm, no rashes, upper right inner arm, red tattoo with some papules and crusting.  No drainage.  No surrounding erythema. Commons side effects, risks, benefits, and alternatives for medications and treatment plan prescribed today were discussed, and the patient expressed understanding of the given instructions. Patient is instructed to call or message via MyChart if he/she has any questions or concerns regarding our treatment plan. No barriers to understanding were identified. We discussed Red Flag symptoms and signs in detail. Patient expressed understanding  regarding what to do in case of urgent or emergency type symptoms.  Medication list was reconciled, printed and provided to the patient in AVS. Patient instructions and summary information was reviewed with the patient as documented in the AVS. This note was prepared with assistance of Dragon voice recognition software. Occasional wrong-word or sound-a-like substitutions may have occurred due to the inherent limitations of voice recognition software

## 2024-03-11 ENCOUNTER — Encounter: Payer: Self-pay | Admitting: Family Medicine

## 2024-03-14 MED ORDER — CLINDAMYCIN PHOS (ONCE-DAILY) 1 % EX GEL
1.0000 | Freq: Every day | CUTANEOUS | 2 refills | Status: AC
Start: 2024-03-14 — End: 2024-03-24

## 2024-03-14 NOTE — Telephone Encounter (Signed)
A total of 6 minutes were spent by me to personally review the patient-generated inquiry, review patient records and data pertinent to assessment of the patient's problem, develop a management plan including generation of prescriptions and/or orders, and on subsequent communication with the patient through secure the MyChart portal service. There is no separately reported E/M service related to this service in the past 7 days nor does the patient have an upcoming soonest available appointment for this issue. This work was completed in less than 7 days.       

## 2024-03-15 ENCOUNTER — Other Ambulatory Visit: Payer: Self-pay | Admitting: Family Medicine

## 2024-03-24 ENCOUNTER — Other Ambulatory Visit (HOSPITAL_COMMUNITY): Payer: Self-pay

## 2024-03-29 MED ORDER — BUPROPION HCL ER (SR) 100 MG PO TB12: 100.0000 mg | ORAL_TABLET | Freq: Two times a day (BID) | ORAL | 3 refills | Status: DC

## 2024-03-29 MED ORDER — NALTREXONE HCL (PAIN) 4.5 MG PO CAPS: ORAL_CAPSULE | ORAL | 5 refills | Status: AC

## 2024-05-19 ENCOUNTER — Ambulatory Visit: Admitting: Family Medicine

## 2024-06-07 ENCOUNTER — Encounter: Payer: Self-pay | Admitting: Family Medicine

## 2024-06-07 ENCOUNTER — Other Ambulatory Visit (HOSPITAL_BASED_OUTPATIENT_CLINIC_OR_DEPARTMENT_OTHER): Payer: Self-pay

## 2024-06-07 ENCOUNTER — Ambulatory Visit (INDEPENDENT_AMBULATORY_CARE_PROVIDER_SITE_OTHER): Admitting: Family Medicine

## 2024-06-07 VITALS — BP 124/86 | HR 80 | Temp 98.1°F | Ht 65.0 in | Wt 197.0 lb

## 2024-06-07 DIAGNOSIS — L748 Other eccrine sweat disorders: Secondary | ICD-10-CM

## 2024-06-07 DIAGNOSIS — F330 Major depressive disorder, recurrent, mild: Secondary | ICD-10-CM

## 2024-06-07 DIAGNOSIS — Z8679 Personal history of other diseases of the circulatory system: Secondary | ICD-10-CM | POA: Diagnosis not present

## 2024-06-07 DIAGNOSIS — Z6835 Body mass index (BMI) 35.0-35.9, adult: Secondary | ICD-10-CM

## 2024-06-07 DIAGNOSIS — E66812 Obesity, class 2: Secondary | ICD-10-CM | POA: Diagnosis not present

## 2024-06-07 MED ORDER — TIRZEPATIDE-WEIGHT MANAGEMENT 10 MG/0.5ML ~~LOC~~ SOLN: 10.0000 mg | SUBCUTANEOUS | 0 refills | Status: DC

## 2024-06-07 MED ORDER — CITALOPRAM HYDROBROMIDE 20 MG PO TABS
20.0000 mg | ORAL_TABLET | Freq: Every day | ORAL | 3 refills | Status: AC
  Filled 2024-06-07: qty 90, 90d supply, fill #0
  Filled 2024-10-07: qty 90, 90d supply, fill #1

## 2024-06-07 NOTE — Patient Instructions (Signed)
 Please follow up as scheduled for your next visit with me: 10/03/2024   If you have any questions or concerns, please don't hesitate to send me a message via MyChart or call the office at [phone]. Thank you for visiting with us  today! It's our pleasure caring for you.   I have sent in a prescription for Zepbound  10 mg weekly.  Please let me know how that does in next month after I will send in 12.5 mg weekly.  I have ordered citalopram  20 mg tablets to start again now.

## 2024-06-07 NOTE — Progress Notes (Signed)
 Subjective  CC:  Chief Complaint  Patient presents with   Obesity   Depression   Hypertension    HPI: Debra Giles is a 43 y.o. female who presents to the office today to address the problems listed above in the chief complaint. Discussed the use of AI scribe software for clinical note transcription with the patient, who gave verbal consent to proceed.  History of Present Illness Debra Giles is a 43 year old female who presents for weight management and medication adjustment. Wt Readings from Last 3 Encounters:  06/07/24 197 lb (89.4 kg)  03/09/24 185 lb 6.4 oz (84.1 kg)  09/07/23 174 lb 9.6 oz (79.2 kg)    Obesity: Frustrated, weight is rising.  She remains on Zepbound , has been on a dose of 7.5 mg weekly.  Helps maintain but it cannot help lose at the moment.  She has been on higher doses and tolerates them well.  Never was able to add Contrave  because of cost.  Diet is fluctuating.  Exercises consistent.  Trying to get in enough protein daily about 120 mg   We decreased her Celexa  3 months ago however her mood is now suffering.  More irritable, inseminated at times and definitely feeling more apathetic.  Would like to increase back to 20 mg daily.  She has been on this for over a decade and it works well for her.  She did notice that the lower dose helped her libido but she finds the trade off with while.  No change in weight management  Axillary odor: Has tried multiple different deodorants.  Will use topical clindamycin  without any benefit either.   Assessment  1. Class 2 severe obesity due to excess calories with serious comorbidity and body mass index (BMI) of 35.0 to 35.9 in adult [facility])   2. Mild episode of recurrent major depressive disorder (HCC)   3. History of hypertension   4. Axillary odor [L74.8]      Plan  Assessment and Plan Assessment & Plan Obesity Zepbound  at 7.5 mg maintained weight but did not promote further loss. She experienced cravings  on a lower dose. Frustrated with progress despite high protein intake and regular exercise. - Increase Zepbound  to 10 mg in October, consider 12.5 mg or 15 mg if needed. - Consider low dose naltrexone  if no progress. - Encourage continued high protein intake and regular exercise. - Discuss digestive enzymes for protein absorption.  Major depressive disorder and anxiety disorder Increased irritability and lack of interest after reducing Celexa  from 20 mg to 10 mg. Symptoms include quick temper and lack of patience. - Increase Celexa  back to 20 mg.  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 - Refer to dermatologist for further evaluation and management.  Patient declines Botox injections    Follow up: 3 months for complete physical No orders of the defined types were placed in this encounter.  Meds ordered this encounter  Medications   citalopram  (CELEXA ) 20 MG tablet    Sig: Take 1 tablet (20 mg total) by mouth daily.    Dispense:  90 tablet    Refill:  3   tirzepatide  10 MG/0.5ML injection vial    Sig: Inject 10 mg into the skin once a week.    Dispense:  2 mL    Refill:  0    Please hold for next months refill.     I reviewed the patients updated PMH, FH, and SocHx.  Patient Active  Problem List   Diagnosis Date Noted   History of hypertension 06/06/2016    Priority: High   Class 2 severe obesity due to excess calories with serious comorbidity and body mass index (BMI) of 35.0 to 35.9 in adult [facility]) 06/06/2016    Priority: High   Recurrent major depressive disorder, in full remission (HCC) 06/06/2016    Priority: High   History of total hip arthroplasty, left 08/06/2021    Priority: Medium    Chronic left shoulder pain 03/14/2020    Priority: Medium    History of gestational diabetes 03/14/2020    Priority: Medium    Gastroesophageal reflux disease without esophagitis 06/06/2016    Priority: Medium    IUD  contraception 08/23/2012    Priority: Medium    Intrinsic atopic dermatitis 09/03/2022    Priority: Low   B12 deficiency 02/04/2022    Priority: Low   Current Meds  Medication Sig   citalopram  (CELEXA ) 20 MG tablet Take 1 tablet (20 mg total) by mouth daily.   cyanocobalamin  (VITAMIN B12) 1000 MCG tablet Take 1,000 mcg by mouth daily.   Iron-Vit C-Vit B12-Folic Acid  (IRON 100 PLUS) 100-250-0.025-1 MG TABS Take 195 mg by mouth daily.   omeprazole  (PRILOSEC) 20 MG capsule Take 20 mg by mouth in the morning and at bedtime. (Patient taking differently: Take 20 mg by mouth in the morning and at bedtime. $0 in the morning and 40 at night)   tirzepatide  10 MG/0.5ML injection vial Inject 10 mg into the skin once a week.   [DISCONTINUED] citalopram  (CELEXA ) 10 MG tablet Take 1 tablet (10 mg total) by mouth daily.   [DISCONTINUED] tirzepatide  (ZEPBOUND ) 7.5 MG/0.5ML injection vial Inject 7.5 mg into the skin once a week.   Allergies: Patient is allergic to percocet [oxycodone -acetaminophen ] and hydrocortisone. Family History: Patient family history includes ADD / ADHD in her brother; Frontotemporal dementia in her mother; Lupus in her brother. Social History:  Patient  reports that she has quit smoking. She has never used smokeless tobacco. She reports that she does not drink alcohol  and does not use drugs.  Review of Systems: Constitutional: Negative for fever malaise or anorexia Cardiovascular: negative for chest pain Respiratory: negative for SOB or persistent cough Gastrointestinal: negative for abdominal pain  Objective  Vitals: BP 124/86   Pulse 80   Temp 98.1 F (36.7 C)   Ht 5' 5 (1.651 m)   Wt 197 lb (89.4 kg)   SpO2 99%   BMI 32.78 kg/m  General: no acute distress , A&Ox3 HEENT: PEERL, conjunctiva normal, neck is supple Skin:  Warm, no rashes Commons side effects, risks, benefits, and alternatives for medications and treatment plan prescribed today were discussed, and the  patient expressed understanding of the given instructions. Patient is instructed to call or message via MyChart if he/she has any questions or concerns regarding our treatment plan. No barriers to understanding were identified. We discussed Red Flag symptoms and signs in detail. Patient expressed understanding regarding what to do in case of urgent or emergency type symptoms.  Medication list was reconciled, printed and provided to the patient in AVS. Patient instructions and summary information was reviewed with the patient as documented in the AVS. This note was prepared with assistance of Dragon voice recognition software. Occasional wrong-word or sound-a-like substitutions may have occurred due to the inherent limitations of voice recognition software

## 2024-07-20 ENCOUNTER — Other Ambulatory Visit: Payer: Self-pay | Admitting: Family Medicine

## 2024-07-29 ENCOUNTER — Encounter: Payer: Self-pay | Admitting: Family Medicine

## 2024-08-01 MED ORDER — ZEPBOUND 12.5 MG/0.5ML ~~LOC~~ SOLN: 12.5000 mg | SUBCUTANEOUS | 5 refills | Status: AC

## 2024-10-03 ENCOUNTER — Encounter: Admitting: Family Medicine
